# Patient Record
Sex: Female | Born: 2000 | Race: Black or African American | Hispanic: No | Marital: Single | Smoking: Never smoker
Health system: Southern US, Community
[De-identification: ages and names within clinical notes are randomized; demographics above are authoritative.]

---
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
## 2019-01-16 NOTE — L&D Delivery Note (Signed)
[facility]N [facility] Delivery Note  Amy Graham is a 19 y.o. G1P1001 s/p vag del at [redacted]w[redacted]d. She was admitted for SROM/SOL.   ROM: 11h 44m with clear fluid GBS Status: neg Maximum Maternal Temperature: 98.7  Labor Progress: [name] Ms Tibbitts is a G1 who presented with SROM and in latent labor. She progressed spontaneously to vag del.  Delivery Date/Time: December 16, 2019 at 1950 Delivery: Called to room and patient was complete and pushing. Head delivered ROA. Nuchal cord present x 1, easily reduced prior to del. Shoulder and body delivered in usual fashion. Infant with spontaneous cry, placed on mother's abdomen, dried and stimulated. Cord clamped x 2 after 1-minute delay, and cut by mother of pt. Cord blood drawn. Placenta delivered spontaneously with gentle cord traction. Fundus firm with massage and Pitocin. Labia, perineum, vagina, and cervix inspected and found to have 1st degree perineal lac.   Placenta: spont, intact Complications: none Lacerations: 1st degree perineal lac, repaired in the usual fashion with 3-0 Vicryl EBL: 350cc Analgesia: 1% lidocaine  Postpartum Planning [x]  message to sent to schedule follow-up   Infant: boy  APGARs 9/9  3436g (7lb 9.2oz)  11/9, CNM  12/16/2019 9:28 PM

## 2019-05-09 ENCOUNTER — Encounter (HOSPITAL_COMMUNITY): Payer: Self-pay

## 2019-05-09 ENCOUNTER — Ambulatory Visit (HOSPITAL_COMMUNITY)
Admission: EM | Admit: 2019-05-09 | Discharge: 2019-05-09 | Disposition: A | Discharge status: Home / Self Care | Payer: Medicaid Other | Attending: Family Medicine | Admitting: Family Medicine

## 2019-05-09 ENCOUNTER — Other Ambulatory Visit: Payer: Self-pay

## 2019-05-09 DIAGNOSIS — Z3201 Encounter for pregnancy test, result positive: Secondary | ICD-10-CM | POA: Insufficient documentation

## 2019-05-09 DIAGNOSIS — R112 Nausea with vomiting, unspecified: Secondary | ICD-10-CM | POA: Insufficient documentation

## 2019-05-09 DIAGNOSIS — Z20822 Contact with and (suspected) exposure to covid-19: Secondary | ICD-10-CM | POA: Diagnosis not present

## 2019-05-09 LAB — POCT URINALYSIS DIP (DEVICE)
Bilirubin Urine: NEGATIVE
Glucose, UA: NEGATIVE mg/dL
Hgb urine dipstick: NEGATIVE
Ketones, ur: NEGATIVE mg/dL
Nitrite: NEGATIVE
Protein, ur: 30 mg/dL — AB
Specific Gravity, Urine: 1.02 (ref 1.005–1.030)
Urobilinogen, UA: 1 mg/dL (ref 0.0–1.0)
pH: 7.5 (ref 5.0–8.0)

## 2019-05-09 LAB — SARS CORONAVIRUS 2 (TAT 6-24 HRS): SARS Coronavirus 2: NEGATIVE

## 2019-05-09 LAB — POCT PREGNANCY, URINE: Preg Test, Ur: POSITIVE — AB

## 2019-05-09 LAB — POC URINE PREG, ED: Preg Test, Ur: POSITIVE — AB

## 2019-05-09 MED ORDER — PRENATAL MULTIVITAMIN CH: 1.0000 | ORAL_TABLET | Freq: Every day | ORAL | 2 refills | Status: AC

## 2019-05-09 MED ORDER — ONDANSETRON 4 MG PO TBDP: 4.0000 mg | ORAL_TABLET | Freq: Three times a day (TID) | ORAL | 0 refills | Status: DC | PRN

## 2019-05-09 NOTE — ED Provider Notes (Signed)
[place]   [id number] 05/09/19 Arrival Time: 8469  ASSESSMENT & PLAN:  1. Positive pregnancy test   2. Intractable vomiting with nausea, unspecified vomiting type     Benign abdominal exam. No indications for urgent abdominal/pelvic imaging at this time. Discussed. Urine culture sent.  Begin: Meds ordered this encounter  Medications  . ondansetron (ZOFRAN-ODT) 4 MG disintegrating tablet    Sig: Take 1 tablet (4 mg total) by mouth every 8 (eight) hours as needed for nausea or vomiting.    Dispense:  20 tablet    Refill:  0  . Prenatal Vit-Fe Fumarate-FA (PRENATAL MULTIVITAMIN) TABS tablet    Sig: Take 1 tablet by mouth daily at 12 noon.    Dispense:  30 tablet    Refill:  2    Follow-up Information    Schedule an appointment as soon as possible for a visit  with [facility].   Specialty: Obstetrics and Gynecology Contact information: [address], [place] [id number] [place] [phone]          Will do her best to increase fluid intake.  Reviewed expectations re: course of current medical issues. Questions answered. Outlined signs and symptoms indicating need for more acute intervention. Patient verbalized understanding. After Visit Summary given.   SUBJECTIVE: History from: patient. Amy Graham is a 19 y.o. female who presents requesting UPT. Pos at home. Reports nausea with occas emesis. Abd cramping without significant pain. No vaginal bleeding or LOF. Patient's last menstrual period was 03/25/2019. Feels she is drinking enough fluids. G1P0. Some fatigue.  History reviewed. No pertinent surgical history.   OBJECTIVE:  Vitals:   05/09/19 1114 05/09/19 1117  BP:  109/71  Pulse:  75  Resp:  17  Temp:  98.3 F (36.8 C)  TempSrc:  Oral  SpO2:  100%  Weight: 62.6 kg     General appearance: alert, oriented, no acute distress HEENT: [place]; AT; oropharynx  moist Lungs: unlabored respirations Abdomen: soft; without distention; no specific tenderness to palpation; without guarding or rebound tenderness Back: without reported CVA tenderness; FROM at waist Extremities: without LE edema; symmetrical; without gross deformities Skin: warm and dry Neurologic: normal gait Psychological: alert and cooperative; normal mood and affect  Labs: Results for orders placed or performed during the hospital encounter of 05/09/19  POC urine pregnancy  Result Value Ref Range   Preg Test, Ur POSITIVE (A) NEGATIVE  Pregnancy, urine POC  Result Value Ref Range   Preg Test, Ur POSITIVE (A) NEGATIVE  POCT urinalysis dip (device)  Result Value Ref Range   Glucose, UA NEGATIVE NEGATIVE mg/dL   Bilirubin Urine NEGATIVE NEGATIVE   Ketones, ur NEGATIVE NEGATIVE mg/dL   Specific Gravity, Urine 1.020 1.005 - 1.030   Hgb urine dipstick NEGATIVE NEGATIVE   pH 7.5 5.0 - 8.0   Protein, ur 30 (A) NEGATIVE mg/dL   Urobilinogen, UA 1.0 0.0 - 1.0 mg/dL   Nitrite NEGATIVE NEGATIVE   Leukocytes,Ua TRACE (A) NEGATIVE     No Known Allergies                                             History reviewed. No pertinent past medical history.  Social History   Socioeconomic History  . Marital status: Single    Spouse name: Not on file  .  Number of children: Not on file  . Years of education: Not on file  . Highest education level: Not on file  Occupational History  . Not on file  Tobacco Use  . Smoking status: Never Smoker  . Smokeless tobacco: Never Used  Substance and Sexual Activity  . Alcohol use: No  . Drug use: No  . Sexual activity: Yes    Birth control/protection: None  Other Topics Concern  . Not on file  Social History Narrative  . Not on file   Social Determinants of Health   Financial Resource Strain:   . Difficulty of Paying Living Expenses:   Food Insecurity:   . Worried About Programme researcher, broadcasting/film/video in the Last Year:   . Barista in the  Last Year:   Transportation Needs:   . Freight forwarder (Medical):   [name] Lack of Transportation (Non-Medical):   Physical Activity:   . Days of Exercise per Week:   . Minutes of Exercise per Session:   Stress:   . Feeling of Stress :   Social Connections:   . Frequency of Communication with Friends and Family:   . Frequency of Social Gatherings with Friends and Family:   . Attends Religious Services:   . Active Member of Clubs or Organizations:   . Attends Banker Meetings:   [name] Marital Status:   Intimate Partner Violence:   . Fear of Current or Ex-Partner:   . Emotionally Abused:   [name] Physically Abused:   . Sexually Abused:     Family History  Problem Relation Age of Onset  . Diabetes Mother   . Healthy Father      Mardella Layman, MD 05/09/19 1229

## 2019-05-09 NOTE — ED Triage Notes (Signed)
Pt is here with abdominal pain, vomiting that started 3 days ago. Pt has not taken any meds to relieve discomfort. Pt states she took a pregnancy test on 04/28/2019 that was POSITIVE, she wants another test to confirm.

## 2019-05-10 LAB — URINE CULTURE

## 2019-05-14 ENCOUNTER — Encounter (HOSPITAL_COMMUNITY): Payer: Self-pay | Admitting: *Deleted

## 2019-05-14 ENCOUNTER — Inpatient Hospital Stay (HOSPITAL_COMMUNITY): Payer: Medicaid Other

## 2019-05-14 ENCOUNTER — Inpatient Hospital Stay (HOSPITAL_COMMUNITY)
Admission: AD | Admit: 2019-05-14 | Discharge: 2019-05-14 | Disposition: A | Discharge status: Home / Self Care | Payer: Medicaid Other | Attending: Obstetrics & Gynecology | Admitting: Obstetrics & Gynecology

## 2019-05-14 DIAGNOSIS — O210 Mild hyperemesis gravidarum: Secondary | ICD-10-CM | POA: Insufficient documentation

## 2019-05-14 DIAGNOSIS — R109 Unspecified abdominal pain: Secondary | ICD-10-CM | POA: Diagnosis present

## 2019-05-14 DIAGNOSIS — Z3490 Encounter for supervision of normal pregnancy, unspecified, unspecified trimester: Secondary | ICD-10-CM

## 2019-05-14 DIAGNOSIS — O219 Vomiting of pregnancy, unspecified: Secondary | ICD-10-CM

## 2019-05-14 DIAGNOSIS — Z3A01 Less than 8 weeks gestation of pregnancy: Secondary | ICD-10-CM | POA: Insufficient documentation

## 2019-05-14 DIAGNOSIS — O26899 Other specified pregnancy related conditions, unspecified trimester: Secondary | ICD-10-CM

## 2019-05-14 DIAGNOSIS — O26891 Other specified pregnancy related conditions, first trimester: Secondary | ICD-10-CM | POA: Diagnosis not present

## 2019-05-14 LAB — CBC
HCT: 38.2 % (ref 36.0–46.0)
Hemoglobin: 12.6 g/dL (ref 12.0–15.0)
MCH: 29 pg (ref 26.0–34.0)
MCHC: 33 g/dL (ref 30.0–36.0)
MCV: 88 fL (ref 80.0–100.0)
Platelets: 336 10*3/uL (ref 150–400)
RBC: 4.34 MIL/uL (ref 3.87–5.11)
RDW: 12 % (ref 11.5–15.5)
WBC: 7 10*3/uL (ref 4.0–10.5)
nRBC: 0 % (ref 0.0–0.2)

## 2019-05-14 LAB — URINALYSIS, ROUTINE W REFLEX MICROSCOPIC
Bilirubin Urine: NEGATIVE
Glucose, UA: NEGATIVE mg/dL
Hgb urine dipstick: NEGATIVE
Ketones, ur: NEGATIVE mg/dL
Nitrite: NEGATIVE
Protein, ur: 30 mg/dL — AB
Specific Gravity, Urine: 1.029 (ref 1.005–1.030)
pH: 5 (ref 5.0–8.0)

## 2019-05-14 LAB — WET PREP, GENITAL
Clue Cells Wet Prep HPF POC: NONE SEEN
Sperm: NONE SEEN
Trich, Wet Prep: NONE SEEN
Yeast Wet Prep HPF POC: NONE SEEN

## 2019-05-14 LAB — COMPREHENSIVE METABOLIC PANEL
ALT: 16 U/L (ref 0–44)
AST: 18 U/L (ref 15–41)
Albumin: 3.6 g/dL (ref 3.5–5.0)
Alkaline Phosphatase: 54 U/L (ref 38–126)
Anion gap: 6 (ref 5–15)
BUN: 6 mg/dL (ref 6–20)
CO2: 27 mmol/L (ref 22–32)
Calcium: 9.4 mg/dL (ref 8.9–10.3)
Chloride: 103 mmol/L (ref 98–111)
Creatinine, Ser: 0.67 mg/dL (ref 0.44–1.00)
GFR calc Af Amer: >60 mL/min (ref >60)
GFR calc non Af Amer: >60 mL/min (ref >60)
Glucose, Bld: 86 mg/dL (ref 70–99)
Potassium: 3.9 mmol/L (ref 3.5–5.1)
Sodium: 136 mmol/L (ref 135–145)
Total Bilirubin: 0.5 mg/dL (ref 0.3–1.2)
Total Protein: 6.9 g/dL (ref 6.5–8.1)

## 2019-05-14 LAB — ABO/RH: ABO/RH(D): A POS

## 2019-05-14 LAB — HCG, QUANTITATIVE, PREGNANCY: hCG, Beta Chain, Quant, S: 101837 m[IU]/mL — ABNORMAL HIGH (ref <5)

## 2019-05-14 NOTE — MAU Provider Note (Signed)
History     CSN: [id number]  Arrival date and time: 05/14/19 1241   First Provider Initiated Contact with Patient 05/14/19 1353      Chief Complaint  Patient presents with  . Abdominal Pain  . Emesis  . Nausea  . Dizziness   Ms. Amy Graham is a 19 y.o. G1P0 at [redacted]w[redacted]d who presents to MAU for sharp abdominal pain.  Onset: few days ago Location: lower abdomen Duration: few days Character: sharp, intermittent Aggravating/Associated: none/intermittent nausea and vomiting, not present at this time Relieving: none Treatment: ginger ale - worked Severity: 2/10  Pt denies VB, vaginal discharge/odor/itching. Pt denies constipation, diarrhea, or urinary problems. Pt denies fever, chills, fatigue, sweating or changes in appetite. Pt denies SOB or chest pain. Pt denies dizziness, HA, light-headedness, weakness.  Problems this pregnancy include: pt has not yet been seen. Allergies? NKDA Current medications/supplements? PNVs Prenatal care provider? List of OB providers given  Patient's mother present for entire visit.   OB History    Gravida  1   Para      Term      Preterm      AB      Living        SAB      TAB      Ectopic      Multiple      Live Births              History reviewed. No pertinent past medical history.  History reviewed. No pertinent surgical history.  Family History  Problem Relation Age of Onset  . Diabetes Mother   . Healthy Father     Social History   Tobacco Use  . Smoking status: Never Smoker  . Smokeless tobacco: Never Used  Substance Use Topics  . Alcohol use: No  . Drug use: No    Allergies: No Known Allergies  Medications Prior to Admission  Medication Sig Dispense Refill Last Dose  . ondansetron (ZOFRAN-ODT) 4 MG disintegrating tablet Take 1 tablet (4 mg total) by mouth every 8 (eight) hours as needed for nausea or vomiting. 20 tablet 0 05/13/2019 at Unknown time  . Prenatal Vit-Fe Fumarate-FA (PRENATAL  MULTIVITAMIN) TABS tablet Take 1 tablet by mouth daily at 12 noon. 30 tablet 2 05/14/2019 at Unknown time  . clobetasol ointment (TEMOVATE) 0.05 % Apply 1 application topically 2 (two) times daily. To hand 30 g 0   . Erythromycin 2 % ointment Apply to axilla twice daily for 10 days 50 g 1     Review of Systems  Constitutional: Negative for chills, diaphoresis, fatigue and fever.  Eyes: Negative for visual disturbance.  Respiratory: Negative for shortness of breath.   Cardiovascular: Negative for chest pain.  Gastrointestinal: Positive for abdominal pain. Negative for constipation, diarrhea, nausea and vomiting.  Genitourinary: Negative for dysuria, flank pain, frequency, pelvic pain, urgency, vaginal bleeding and vaginal discharge.  Neurological: Negative for dizziness, weakness, light-headedness and headaches.   Physical Exam   Blood pressure 111/76, pulse 85, temperature 99 F (37.2 C), resp. rate 16, height 5\' 5"  (1.651 m), weight 62.3 kg, last menstrual period 03/25/2019, SpO2 100 %.  Patient Vitals for the past 24 hrs:  BP Temp Pulse Resp SpO2 Height Weight  05/14/19 1302 111/76 99 F (37.2 C) 85 16 100 % 5\' 5"  (1.651 m) 62.3 kg   Physical Exam  Constitutional: She is oriented to person, place, and time. She appears well-developed and well-nourished. No distress.  HENT:  Head: Normocephalic and atraumatic.  Respiratory: Effort normal.  GI: Soft. She exhibits no distension and no mass. There is no abdominal tenderness. There is no rebound and no guarding.  Genitourinary:    No vaginal discharge.   Neurological: She is alert and oriented to person, place, and time.  Skin: Skin is warm and dry. She is not diaphoretic.  Psychiatric: She has a normal mood and affect. Her behavior is normal. Judgment and thought content normal.   Results for orders placed or performed during the hospital encounter of 05/14/19 (from the past 24 hour(s))  Urinalysis, Routine w reflex microscopic      Status: Abnormal   Collection Time: 05/14/19  1:44 PM  Result Value Ref Range   Color, Urine YELLOW YELLOW   APPearance HAZY (A) CLEAR   Specific Gravity, Urine 1.029 1.005 - 1.030   pH 5.0 5.0 - 8.0   Glucose, UA NEGATIVE NEGATIVE mg/dL   Hgb urine dipstick NEGATIVE NEGATIVE   Bilirubin Urine NEGATIVE NEGATIVE   Ketones, ur NEGATIVE NEGATIVE mg/dL   Protein, ur 30 (A) NEGATIVE mg/dL   Nitrite NEGATIVE NEGATIVE   Leukocytes,Ua SMALL (A) NEGATIVE   RBC / HPF 0-5 0 - 5 RBC/hpf   WBC, UA 6-10 0 - 5 WBC/hpf   Bacteria, UA RARE (A) NONE SEEN   Squamous Epithelial / LPF 6-10 0 - 5   Mucus PRESENT   Wet prep, genital     Status: Abnormal   Collection Time: 05/14/19  1:50 PM  Result Value Ref Range   Yeast Wet Prep HPF POC NONE SEEN NONE SEEN   Trich, Wet Prep NONE SEEN NONE SEEN   Clue Cells Wet Prep HPF POC NONE SEEN NONE SEEN   WBC, Wet Prep HPF POC RARE (A) NONE SEEN   Sperm NONE SEEN   CBC     Status: None   Collection Time: 05/14/19  1:52 PM  Result Value Ref Range   WBC 7.0 4.0 - 10.5 K/uL   RBC 4.34 3.87 - 5.11 MIL/uL   Hemoglobin 12.6 12.0 - 15.0 g/dL   HCT 38.2 36.0 - 46.0 %   MCV 88.0 80.0 - 100.0 fL   MCH 29.0 26.0 - 34.0 pg   MCHC 33.0 30.0 - 36.0 g/dL   RDW 12.0 11.5 - 15.5 %   Platelets 336 150 - 400 K/uL   nRBC 0.0 0.0 - 0.2 %  Comprehensive metabolic panel     Status: None   Collection Time: 05/14/19  1:52 PM  Result Value Ref Range   Sodium 136 135 - 145 mmol/L   Potassium 3.9 3.5 - 5.1 mmol/L   Chloride 103 98 - 111 mmol/L   CO2 27 22 - 32 mmol/L   Glucose, Bld 86 70 - 99 mg/dL   BUN 6 6 - 20 mg/dL   Creatinine, Ser 0.67 0.44 - 1.00 mg/dL   Calcium 9.4 8.9 - 10.3 mg/dL   Total Protein 6.9 6.5 - 8.1 g/dL   Albumin 3.6 3.5 - 5.0 g/dL   AST 18 15 - 41 U/L   ALT 16 0 - 44 U/L   Alkaline Phosphatase 54 38 - 126 U/L   Total Bilirubin 0.5 0.3 - 1.2 mg/dL   GFR calc non Af Amer >60 >60 mL/min   GFR calc Af Amer >60 >60 mL/min   Anion gap 6 5 - 15  hCG,  quantitative, pregnancy     Status: Abnormal   Collection Time: 05/14/19  1:52 PM  Result Value Ref Range   hCG, Beta Chain, Quant, S 101,837 (H) <5 mIU/mL  ABO/Rh     Status: None   Collection Time: 05/14/19  1:52 PM  Result Value Ref Range   ABO/RH(D) A POS    No rh immune globuloin      NOT A RH IMMUNE GLOBULIN CANDIDATE, PT RH POSITIVE Performed at [facility], [address], [place], [place] [address]     MAU Course  Procedures  MDM -r/o ectopic -UA: hazy/30PRO/sm leuks/rare bacteria, sending urine for culture -CBC: WNL -CMP: WNL -[place]: single IUP, [redacted]w[redacted]d, FHR 134 -hCG: pending at time of discharge -ABO: A Positive -WetPrep: WNL -GC/CT collected -pt discharged to home in stable condition  Orders Placed This Encounter  Procedures  . Wet prep, genital    Standing Status:   Standing    Number of Occurrences:   1  . Culture, OB Urine    Standing Status:   Standing    Number of Occurrences:   1  . US OB LESS THAN 14 WEEKS WITH OB TRANSVAGINAL    Standing Status:   Standing    Number of Occurrences:   1    Order Specific Question:   Symptom/Reason for Exam    Answer:   Abdominal pain in pregnancy [[id number]]  . Urinalysis, Routine w reflex microscopic    Standing Status:   Standing    Number of Occurrences:   1  . CBC    Standing Status:   Standing    Number of Occurrences:   1  . Comprehensive metabolic panel    Standing Status:   Standing    Number of Occurrences:   1  . hCG, quantitative, pregnancy    Standing Status:   Standing    Number of Occurrences:   1  . ABO/Rh    Standing Status:   Standing    Number of Occurrences:   1  . Discharge patient    Order Specific Question:   Discharge disposition    Answer:   01-Home or Self Care [1]    Order Specific Question:   Discharge patient date    Answer:   05/14/2019   No orders of the defined types were placed in this encounter.  Assessment and Plan   1. Abdominal pain in pregnancy   2. Intrauterine  pregnancy   3. [redacted] weeks gestation of pregnancy   4. Nausea and vomiting in pregnancy     Allergies as of 05/14/2019   No Known Allergies     Medication List    STOP taking these medications   ondansetron 4 MG disintegrating tablet Commonly known as: ZOFRAN-ODT     TAKE these medications   clobetasol ointment 0.05 % Commonly known as: TEMOVATE Apply 1 application topically 2 (two) times daily. To hand   Erythromycin 2 % ointment Apply to axilla twice daily for 10 days   prenatal multivitamin Tabs tablet Take 1 tablet by mouth daily at 12 noon.       -will call with culture results, if positive -Discussed the risk of using Zofran in early pregnancy: Available data suggest that use of Zofran (Ondansetron) in early pregnancy is not associated with a high risk of congenital malformations but a small absolute increase in risk of CV malformations (especially septum defects) and cleft palate may exist. The patient was warned of SE of constipation. Discussed Diclegis and other meds as alternatives prior to using Zofran. -pt to schedule NOB  visit, list of providers given -safe meds in pregnancy list given with focus on OTC Diclegis for N/V -discussed nonpharmacologic and pharmacologic treatments of N/V -discussed normal expectations for N/V in pregnancy -return MAU precautions given -pt discharged to home in stable condition  Joni Reining E Goro Wenrick 05/14/2019, 3:52 PM

## 2019-05-14 NOTE — MAU Note (Signed)
Having sharp, severe cramping in abd.  Was mild when went to ER. "also having little heartburns. Been vomiting, feeling light headed and dizzy. Having flutters in her heart."

## 2019-05-14 NOTE — Discharge Instructions (Signed)
Safe Medications in Pregnancy    Acne: Benzoyl Peroxide Salicylic Acid  Backache/Headache: Tylenol: 2 regular strength every 4 hours OR              2 Extra strength every 6 hours  Colds/Coughs/Allergies: Benadryl (alcohol free) 25 mg every 6 hours as needed Breath right strips Claritin Cepacol throat lozenges Chloraseptic throat spray Cold-Eeze- up to three times per day Cough drops, alcohol free Flonase (by prescription only) Guaifenesin Mucinex Robitussin DM (plain only, alcohol free) Saline nasal spray/drops Sudafed (pseudoephedrine) & Actifed ** use only after [redacted] weeks gestation and if you do not have high blood pressure Tylenol Vicks Vaporub Zinc lozenges Zyrtec   Constipation: Colace Ducolax suppositories Fleet enema Glycerin suppositories Metamucil Milk of magnesia Miralax Senokot Smooth move tea  Diarrhea: Kaopectate Imodium A-D  *NO pepto Bismol  Hemorrhoids: Anusol Anusol HC Preparation H Tucks  Indigestion: Tums Maalox Mylanta Zantac  Pepcid  Insomnia: Benadryl (alcohol free)  every 6 hours as needed Tylenol PM Unisom, no Gelcaps  Leg Cramps: Tums MagGel  Nausea/Vomiting:  Bonine Dramamine Emetrol Ginger extract Sea bands Meclizine  - DIZZINESS Nausea medication to take during pregnancy:  Unisom (doxylamine succinate 25 mg tablets) Take one tablet daily at bedtime. If symptoms are not adequately controlled, the dose can be increased to a maximum recommended dose of two tablets daily (1/2 tablet in the morning, 1/2 tablet mid-afternoon and one at bedtime). Vitamin B6  tablets. Take one tablet twice a day (up to 200 mg per day).  Skin Rashes: Aveeno products Benadryl cream or  every 6 hours as needed Calamine Lotion 1% cortisone cream  Yeast infection: Gyne-lotrimin 7 Monistat 7   **If taking multiple medications, please check labels to avoid duplicating the same active  ingredients **take medication as directed on the label ** Do not exceed 4000 mg of tylenol in 24 hours **Do not take medications that contain aspirin or ibuprofen         Eating Plan for Pregnant Women While you are pregnant, your body requires additional nutrition to help support your growing baby. You also have a higher need for some vitamins and minerals, such as folic acid, calcium, iron, and vitamin D. Eating a healthy, well-balanced diet is very important for your health and your baby's health. Your need for extra calories varies for the three 58-month segments of your pregnancy (trimesters). For most women, it is recommended to consume:  150 extra calories a day during the first trimester.  300 extra calories a day during the second trimester.  300 extra calories a day during the third trimester. What are tips for following this plan?   Do not try to lose weight or go on a diet during pregnancy.  Limit your overall intake of foods that have "empty calories." These are foods that have little nutritional value, such as sweets, desserts, candies, and sugar-sweetened beverages.  Eat a variety of foods (especially fruits and vegetables) to get a full range of vitamins and minerals.  Take a prenatal vitamin to help meet your additional vitamin and mineral needs during pregnancy, specifically for folic acid, iron, calcium, and vitamin D.  Remember to stay active. Ask your health care provider what types of exercise and activities are safe for you.  Practice good food safety and cleanliness. Wash your hands before you eat and after you prepare raw meat. Wash all fruits and vegetables well before peeling or eating. Taking these actions can help to prevent food-borne illnesses that can  be very dangerous to your baby, such as listeriosis. Ask your health care provider for more information about listeriosis. What does 150 extra calories look like? Healthy options that provide 150 extra  calories each day could be any of the following:  6-8 oz (170-230 g) of plain low-fat yogurt with  cup of berries.  1 apple with 2 teaspoons (11 g) of peanut butter.  Cut-up vegetables with  cup (60 g) of hummus.  8 oz (230 mL) or 1 cup of low-fat chocolate milk.  1 stick of string cheese with 1 medium orange.  1 peanut butter and jelly sandwich that is made with one slice of whole-wheat bread and 1 tsp (5 g) of peanut butter. For 300 extra calories, you could eat two of those healthy options each day. What is a healthy amount of weight to gain? The right amount of weight gain for you is based on your BMI before you became pregnant. If your BMI:  Was less than 18 (underweight), you should gain 28-40 lb (13-18 kg).  Was 18-24.9 (normal), you should gain 25-35 lb (11-16 kg).  Was 25-29.9 (overweight), you should gain 15-25 lb (7-11 kg).  Was 30 or greater (obese), you should gain 11-20 lb (5-9 kg). What if I am having twins or multiples? Generally, if you are carrying twins or multiples:  You may need to eat 300-600 extra calories a day.  The recommended range for total weight gain is 25-54 lb (11-25 kg), depending on your BMI before pregnancy.  Talk with your health care provider to find out about nutritional needs, weight gain, and exercise that is right for you. What foods can I eat?  Fruits All fruits. Eat a variety of colors and types of fruit. Remember to wash your fruits well before peeling or eating. Vegetables All vegetables. Eat a variety of colors and types of vegetables. Remember to wash your vegetables well before peeling or eating. Grains All grains. Choose whole grains, such as whole-wheat bread, oatmeal, or brown rice. Meats and other protein foods Lean meats, including chicken, [place], fish, and lean cuts of beef, veal, or pork. If you eat fish or seafood, choose options that are higher in omega-3 fatty acids and lower in mercury, such as salmon, herring,  mussels, trout, sardines, pollock, shrimp, crab, and lobster. Tofu. Tempeh. Beans. Eggs. Peanut butter and other nut butters. Make sure that all meats, poultry, and eggs are cooked to food-safe temperatures or "well-done." Two or more servings of fish are recommended each week in order to get the most benefits from omega-3 fatty acids that are found in seafood. Choose fish that are lower in mercury. You can find more information online:  PumpkinSearch.com.ee Dairy Pasteurized milk and milk alternatives (such as almond milk). Pasteurized yogurt and pasteurized cheese. Cottage cheese. Sour cream. Beverages Water. Juices that contain 100% fruit juice or vegetable juice. Caffeine-free teas and decaffeinated coffee. Drinks that contain caffeine are okay to drink, but it is better to avoid caffeine. Keep your total caffeine intake to less than 200 mg each day (which is 12 oz or 355 mL of coffee, tea, or soda) or the limit as told by your health care provider. Fats and oils Fats and oils are okay to include in moderation. Sweets and desserts Sweets and desserts are okay to include in moderation. Seasoning and other foods All pasteurized condiments. The items listed above may not be a complete list of foods and beverages you can eat. Contact a dietitian for more  information. What foods are not recommended? Fruits Unpasteurized fruit juices. Vegetables Raw (unpasteurized) vegetable juices. Meats and other protein foods Lunch meats, bologna, hot dogs, or other deli meats. (If you must eat those meats, reheat them until they are steaming hot.) Refrigerated pat, meat spreads from a meat counter, smoked seafood that is found in the refrigerated section of a store. Raw or undercooked meats, poultry, and eggs. Raw fish, such as sushi or sashimi. Fish that have high mercury content, such as tilefish, shark, swordfish, and king mackerel. To learn more about mercury in fish, talk with your health care provider or look  for online resources, such as:  PumpkinSearch.com.ee Dairy Raw (unpasteurized) milk and any foods that have raw milk in them. Soft cheeses, such as feta, queso blanco, queso fresco, Brie, Camembert cheeses, blue-veined cheeses, and Panela cheese (unless it is made with pasteurized milk, which must be stated on the label). Beverages Alcohol. Sugar-sweetened beverages, such as sodas, teas, or energy drinks. Seasoning and other foods Homemade fermented foods and drinks, such as pickles, sauerkraut, or kombucha drinks. (Store-bought pasteurized versions of these are okay.) Salads that are made in a store or deli, such as ham salad, chicken salad, egg salad, tuna salad, and seafood salad. The items listed above may not be a complete list of foods and beverages you should avoid. Contact a dietitian for more information. Where to find more information To calculate the number of calories you need based on your height, weight, and activity level, you can use an online calculator such as:  PackageNews.is To calculate how much weight you should gain during pregnancy, you can use an online pregnancy weight gain calculator such as:  http://jones-berg.com/ Summary  While you are pregnant, your body requires additional nutrition to help support your growing baby.  Eat a variety of foods, especially fruits and vegetables to get a full range of vitamins and minerals.  Practice good food safety and cleanliness. Wash your hands before you eat and after you prepare raw meat. Wash all fruits and vegetables well before peeling or eating. Taking these actions can help to prevent food-borne illnesses, such as listeriosis, that can be very dangerous to your baby.  Do not eat raw meat or fish. Do not eat fish that have high mercury content, such as tilefish, shark, swordfish, and king mackerel. Do not eat unpasteurized (raw) dairy.  Take a prenatal vitamin to help meet  your additional vitamin and mineral needs during pregnancy, specifically for folic acid, iron, calcium, and vitamin D. This information is not intended to replace advice given to you by your health care provider. Make sure you discuss any questions you have with your health care provider. Document Revised: 05/22/2018 Document Reviewed: 09/28/2016 Elsevier Patient Education  2020 Elsevier Inc.     [place] [facility] Providers     [facility]     Phone: [phone]  [facility] [place]  Phone: [phone]  [facility] for [facility] [place]  Phone: [phone]  [facility] for [employer] at [place]                           Phone: [phone]  [facility] for [facility]          Phone: [phone]  [facility]    Phone: [phone]   [facility])    Phone: [phone]  [facility]  Phone: [phone]  [facility]    Phone: [phone]  [facility]    Phone: [phone]  [facility]  Phone: [phone]  [facility]               Phone: [phone]  [facility]   Phone: [phone]  [facility]    Phone: [phone]                   Abdominal Pain During Pregnancy  Abdominal pain is common during pregnancy, and has many possible causes. Some causes are more serious than others, and sometimes the cause is not known. Abdominal pain can be a sign that labor is starting. It can also be caused by normal growth and stretching of muscles and ligaments during pregnancy. Always tell your health care provider if you have any abdominal pain. Follow these instructions at home:  Do not have sex or put anything in your vagina until your pain goes away completely.  Get plenty of  rest until your pain improves.  Drink enough fluid to keep your urine pale yellow.  Take over-the-counter and prescription medicines only as told by your health care provider.  Keep all follow-up visits as told by your health care provider. This is important. Contact a health care provider if:  Your pain continues or gets worse after resting.  You have lower abdominal pain that: ? Comes and goes at regular intervals. ? Spreads to your back. ? Is similar to menstrual cramps.  You have pain or burning when you urinate. Get help right away if:  You have a fever or chills.  You have vaginal bleeding.  You are leaking fluid from your vagina.  You are passing tissue from your vagina.  You have vomiting or diarrhea that lasts for more than 24 hours.  Your baby is moving less than usual.  You feel very weak or faint.  You have shortness of breath.  You develop severe pain in your upper abdomen. Summary  Abdominal pain is common during pregnancy, and has many possible causes.  If you experience abdominal pain during pregnancy, tell your health care provider right away.  Follow your health care provider's home care instructions and keep all follow-up visits as directed. This information is not intended to replace advice given to you by your health care provider. Make sure you discuss any questions you have with your health care provider. Document Revised: 04/21/2018 Document Reviewed: 04/05/2016 Elsevier Patient Education  2020 [employer].        First Trimester of Pregnancy The first trimester of pregnancy is from week 1 until the end of week 13 (months 1 through 3). A week after a sperm fertilizes an egg, the egg will implant on the wall of the uterus. This embryo will begin to develop into a baby. Genes from you and your partner will form the baby. The female genes will determine whether the baby will be a boy or a girl. At 6-8 weeks, the eyes and face will be formed,  and the heartbeat can be seen on ultrasound. At the end of 12 weeks, all the baby's organs will be formed. Now that you are pregnant, you will want to do everything you can to have a healthy baby. Two of the most important things are to get good prenatal care and to follow your health care provider's instructions. Prenatal care is all the medical care you receive before  the baby's birth. This care will help prevent, find, and treat any problems during the pregnancy and childbirth. Body changes during your first trimester Your body goes through many changes during pregnancy. The changes vary from woman to woman.  You may gain or lose a couple of pounds at first.  You may feel sick to your stomach (nauseous) and you may throw up (vomit). If the vomiting is uncontrollable, call your health care provider.  You may tire easily.  You may develop headaches that can be relieved by medicines. All medicines should be approved by your health care provider.  You may urinate more often. Painful urination may mean you have a bladder infection.  You may develop heartburn as a result of your pregnancy.  You may develop constipation because certain hormones are causing the muscles that push stool through your intestines to slow down.  You may develop hemorrhoids or swollen veins (varicose veins).  Your breasts may begin to grow larger and become tender. Your nipples may stick out more, and the tissue that surrounds them (areola) may become darker.  Your gums may bleed and may be sensitive to brushing and flossing.  Dark spots or blotches (chloasma, mask of pregnancy) may develop on your face. This will likely fade after the baby is born.  Your menstrual periods will stop.  You may have a loss of appetite.  You may develop cravings for certain kinds of food.  You may have changes in your emotions from day to day, such as being excited to be pregnant or being concerned that something may go wrong with  the pregnancy and baby.  You may have more vivid and strange dreams.  You may have changes in your hair. These can include thickening of your hair, rapid growth, and changes in texture. Some women also have hair loss during or after pregnancy, or hair that feels dry or thin. Your hair will most likely return to normal after your baby is born. What to expect at prenatal visits During a routine prenatal visit:  You will be weighed to make sure you and the baby are growing normally.  Your blood pressure will be taken.  Your abdomen will be measured to track your baby's growth.  The fetal heartbeat will be listened to between weeks 10 and 14 of your pregnancy.  Test results from any previous visits will be discussed. Your health care provider may ask you:  How you are feeling.  If you are feeling the baby move.  If you have had any abnormal symptoms, such as leaking fluid, bleeding, severe headaches, or abdominal cramping.  If you are using any tobacco products, including cigarettes, chewing tobacco, and electronic cigarettes.  If you have any questions. Other tests that may be performed during your first trimester include:  Blood tests to find your blood type and to check for the presence of any previous infections. The tests will also be used to check for low iron levels (anemia) and protein on red blood cells (Rh antibodies). Depending on your risk factors, or if you previously had diabetes during pregnancy, you may have tests to check for high blood sugar that affects pregnant women (gestational diabetes).  Urine tests to check for infections, diabetes, or protein in the urine.  An ultrasound to confirm the proper growth and development of the baby.  Fetal screens for spinal cord problems (spina bifida) and Down syndrome.  HIV (human immunodeficiency virus) testing. Routine prenatal testing includes screening for HIV, unless you  choose not to have this test.  You may need  other tests to make sure you and the baby are doing well. Follow these instructions at home: Medicines  Follow your health care provider's instructions regarding medicine use. Specific medicines may be either safe or unsafe to take during pregnancy.  Take a prenatal vitamin that contains at least 600 micrograms (mcg) of folic acid.  If you develop constipation, try taking a stool softener if your health care provider approves. Eating and drinking   Eat a balanced diet that includes fresh fruits and vegetables, whole grains, good sources of protein such as meat, eggs, or tofu, and low-fat dairy. Your health care provider will help you determine the amount of weight gain that is right for you.  Avoid raw meat and uncooked cheese. These carry germs that can cause birth defects in the baby.  Eating four or five small meals rather than three large meals a day may help relieve nausea and vomiting. If you start to feel nauseous, eating a few soda crackers can be helpful. Drinking liquids between meals, instead of during meals, also seems to help ease nausea and vomiting.  Limit foods that are high in fat and processed sugars, such as fried and sweet foods.  To prevent constipation: ? Eat foods that are high in fiber, such as fresh fruits and vegetables, whole grains, and beans. ? Drink enough fluid to keep your urine clear or pale yellow. Activity  Exercise only as directed by your health care provider. Most women can continue their usual exercise routine during pregnancy. Try to exercise for 30 minutes at least 5 days a week. Exercising will help you: ? Control your weight. ? Stay in shape. ? Be prepared for labor and delivery.  Experiencing pain or cramping in the lower abdomen or lower back is a good sign that you should stop exercising. Check with your health care provider before continuing with normal exercises.  Try to avoid standing for long periods of time. Move your legs often if  you must stand in one place for a long time.  Avoid heavy lifting.  Wear low-heeled shoes and practice good posture.  You may continue to have sex unless your health care provider tells you not to. Relieving pain and discomfort  Wear a good support bra to relieve breast tenderness.  Take warm sitz baths to soothe any pain or discomfort caused by hemorrhoids. Use hemorrhoid cream if your health care provider approves.  Rest with your legs elevated if you have leg cramps or low back pain.  If you develop varicose veins in your legs, wear support hose. Elevate your feet for 15 minutes, 3-4 times a day. Limit salt in your diet. Prenatal care  Schedule your prenatal visits by the twelfth week of pregnancy. They are usually scheduled monthly at first, then more often in the last 2 months before delivery.  Write down your questions. Take them to your prenatal visits.  Keep all your prenatal visits as told by your health care provider. This is important. Safety  Wear your seat belt at all times when driving.  Make a list of emergency phone numbers, including numbers for family, friends, the hospital, and police and fire departments. General instructions  Ask your health care provider for a referral to a local prenatal education class. Begin classes no later than the beginning of month 6 of your pregnancy.  Ask for help if you have counseling or nutritional needs during pregnancy. Your health care  provider can offer advice or refer you to specialists for help with various needs.  Do not use hot tubs, steam rooms, or saunas.  Do not douche or use tampons or scented sanitary pads.  Do not cross your legs for long periods of time.  Avoid cat litter boxes and soil used by cats. These carry germs that can cause birth defects in the baby and possibly loss of the fetus by miscarriage or stillbirth.  Avoid all smoking, herbs, alcohol, and medicines not prescribed by your health care  provider. Chemicals in these products affect the formation and growth of the baby.  Do not use any products that contain nicotine or tobacco, such as cigarettes and e-cigarettes. If you need help quitting, ask your health care provider. You may receive counseling support and other resources to help you quit.  Schedule a dentist appointment. At home, brush your teeth with a soft toothbrush and be gentle when you floss. Contact a health care provider if:  You have dizziness.  You have mild pelvic cramps, pelvic pressure, or nagging pain in the abdominal area.  You have persistent nausea, vomiting, or diarrhea.  You have a bad smelling vaginal discharge.  You have pain when you urinate.  You notice increased swelling in your face, hands, legs, or ankles.  You are exposed to fifth disease or chickenpox.  You are exposed to [place] measles (rubella) and have never had it. Get help right away if:  You have a fever.  You are leaking fluid from your vagina.  You have spotting or bleeding from your vagina.  You have severe abdominal cramping or pain.  You have rapid weight gain or loss.  You vomit blood or material that looks like coffee grounds.  You develop a severe headache.  You have shortness of breath.  You have any kind of trauma, such as from a fall or a car accident. Summary  The first trimester of pregnancy is from week 1 until the end of week 13 (months 1 through 3).  Your body goes through many changes during pregnancy. The changes vary from woman to woman.  You will have routine prenatal visits. During those visits, your health care provider will examine you, discuss any test results you may have, and talk with you about how you are feeling. This information is not intended to replace advice given to you by your health care provider. Make sure you discuss any questions you have with your health care provider. Document Revised: 12/14/2016 Document Reviewed:  12/14/2015 Elsevier Patient Education  2020 [employer].

## 2019-05-15 LAB — CULTURE, OB URINE

## 2019-05-15 LAB — GC/CHLAMYDIA PROBE AMP (~~LOC~~) NOT AT ARMC
Chlamydia: NEGATIVE
Comment: NEGATIVE
Comment: NORMAL
Neisseria Gonorrhea: NEGATIVE

## 2019-06-10 ENCOUNTER — Ambulatory Visit (INDEPENDENT_AMBULATORY_CARE_PROVIDER_SITE_OTHER): Payer: Medicaid Other | Admitting: Advanced Practice Midwife

## 2019-06-10 ENCOUNTER — Encounter: Payer: Self-pay | Admitting: Advanced Practice Midwife

## 2019-06-10 ENCOUNTER — Other Ambulatory Visit: Payer: Self-pay

## 2019-06-10 VITALS — BP 116/76 | HR 70 | Wt 135.8 lb

## 2019-06-10 DIAGNOSIS — Z3401 Encounter for supervision of normal first pregnancy, first trimester: Secondary | ICD-10-CM

## 2019-06-10 DIAGNOSIS — Z3403 Encounter for supervision of normal first pregnancy, third trimester: Secondary | ICD-10-CM | POA: Insufficient documentation

## 2019-06-10 DIAGNOSIS — Z3A11 11 weeks gestation of pregnancy: Secondary | ICD-10-CM | POA: Diagnosis not present

## 2019-06-10 DIAGNOSIS — O219 Vomiting of pregnancy, unspecified: Secondary | ICD-10-CM | POA: Diagnosis not present

## 2019-06-10 MED ORDER — ASPIRIN EC 81 MG PO TBEC: 81.0000 mg | DELAYED_RELEASE_TABLET | Freq: Every day | ORAL | 2 refills | Status: DC

## 2019-06-10 MED ORDER — PYRIDOXINE HCL 25 MG PO TABS: 25.0000 mg | ORAL_TABLET | Freq: Three times a day (TID) | ORAL | 0 refills | Status: DC

## 2019-06-10 MED ORDER — DOXYLAMINE SUCCINATE (SLEEP) 25 MG PO TABS: 25.0000 mg | ORAL_TABLET | Freq: Three times a day (TID) | ORAL | 0 refills | Status: DC | PRN

## 2019-06-10 MED ORDER — BLOOD PRESSURE KIT: PACK | 0 refills | Status: DC

## 2019-06-10 MED ORDER — PROMETHAZINE HCL 25 MG PO TABS: 25.0000 mg | ORAL_TABLET | Freq: Four times a day (QID) | ORAL | 2 refills | Status: DC | PRN

## 2019-06-10 MED ORDER — VITAFOL GUMMIES 3.33-0.333-34.8 MG PO CHEW: 1.0000 | CHEWABLE_TABLET | Freq: Every day | ORAL | 5 refills | Status: DC

## 2019-06-10 NOTE — Patient Instructions (Signed)

## 2019-06-10 NOTE — Progress Notes (Signed)
History:   Amy Graham is a 19 y.o. G1P0 at [redacted]w[redacted]d by first trimester scan in [facility]. She is being seen today for her first obstetrical visit.  No obstetric history. Patient does intend to breast feed. Pregnancy history fully reviewed.  Patient reports fatigue, headaches, nausea and vomiting. She works part time as a Quarry manager. 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 She endorses headaches most days, managing with rest.    HISTORY: OB History  Gravida Para Term Preterm AB Living  1 0 0 0 0 0  SAB TAB Ectopic Multiple Live Births  0 0 0 0 0    # Outcome Date GA Lbr Len/2nd Weight Sex Delivery Anes PTL Lv  1 Current             No pap history, age 7.   History reviewed. No pertinent past medical history. History reviewed. No pertinent surgical history. Family History  Problem Relation Age of Onset  . Diabetes Mother   . Healthy Father    Social History   Tobacco Use  . Smoking status: Never Smoker  . Smokeless tobacco: Never Used  Substance Use Topics  . Alcohol use: No  . Drug use: No   No Known Allergies Current Outpatient Medications on File Prior to Visit  Medication Sig Dispense Refill  . clobetasol ointment (TEMOVATE) 0.81 % Apply 1 application topically 2 (two) times daily. To hand 30 g 0  . Erythromycin 2 % ointment Apply to axilla twice daily for 10 days 50 g 1  . Prenatal Vit-Fe Fumarate-FA (PRENATAL MULTIVITAMIN) TABS tablet Take 1 tablet by mouth daily at 12 noon. 30 tablet 2   No current facility-administered medications on file prior to visit.    Review of Systems Pertinent items noted in HPI and remainder of comprehensive ROS otherwise negative. Physical Exam:   Vitals:   06/10/19 1006  BP: 116/76  Pulse: 70  Weight: 135 lb 12.8 oz (61.6 kg)   Fetal Heart Rate (bpm): 173 Bedside Ultrasound for FHR check: Viable intrauterine pregnancy with positive cardiac activity note, heartrate bpm Patient informed that the ultrasound is  considered a limited obstetric ultrasound and is not intended to be a complete ultrasound exam.  Patient also informed that the ultrasound is not being completed with the intent of assessing for fetal or placental anomalies or any pelvic abnormalities.  Explained that the purpose of today's ultrasound is to assess for fetal heart rate.  Patient acknowledges the purpose of the exam and the limitations of the study. Pelvic Exam:  Not performed                      System: General: well-developed, well-nourished female in no acute distress   Breasts:  normal appearance, no masses or tenderness bilaterally   Skin: normal coloration and turgor, no rashes   Neurologic: oriented, normal, negative, normal mood   Extremities: normal strength, tone, and muscle mass, ROM of all joints is normal   HEENT PERRLA, extraocular movement intact and sclera clear, anicteric   Mouth/Teeth mucous membranes moist, pharynx normal without lesions and dental hygiene good   Neck supple and no masses   Cardiovascular: regular rate and rhythm   Respiratory:  no respiratory distress, normal breath sounds   Abdomen: soft, non-tender; bowel sounds normal; no masses,  no organomegaly    Assessment:    Pregnancy: G1P0 Patient Active Problem List   Diagnosis Date Noted  . Encounter for supervision  of normal first pregnancy in first trimester 06/10/2019   Indications for ASA therapy (per uptodate) Two or more of the following: Nulliparity Yes Obesity (body mass index >30 kg/m2) No Family history of preeclampsia in mother or sister No Age ?35 years No Sociodemographic characteristics (African American race, low socioeconomic level) Yes Personal risk factors (eg, previous pregnancy with low birth weight or small for gestational age infant, previous adverse pregnancy outcome [eg, stillbirth], interval >10 years between pregnancies) No  Plan:    1. Encounter for supervision of normal first pregnancy in first  trimester - Low risk OB, welcomed to practice - S/p MAU visit 04/29, confirmed delivery at [facility] in [place] - Advised restarting mild exercise, self care - Urine Culture - Babyscripts Schedule Optimization - CBC/D/Plt+RPR+Rh+ABO+Rub Ab... - Genetic Screening - Comprehensive metabolic panel - Protein / creatinine ratio, urine  2. Nausea and vomiting in pregnancy - B6, Unisom PRN. Phenergan for breakthrough - Food selection, encouraged to reduce sweet tea consumption, focus on room temperature water  3. Headache in pregnancy - Likely worsened by sugar consumption, dehydration, vomiting - Advised daily Zyrtec, cold compress, reduce screen time PRN, Tylenol - Can consult [facility] if symptoms do not improve  Initial labs drawn. Continue prenatal vitamins. Problem list reviewed and updated. Genetic Screening discussed, First trimester screen, Quad screen and NIPS: ordered. Ultrasound discussed; fetal anatomic survey: ordered. Discussed usage of Babyscripts and virtual visits as additional source of managing and completing prenatal visits in midst of coronavirus and pandemic.   Anticipatory guidance for prenatal visits including labs, ultrasounds, and testing; Initial labs drawn. Encouraged to complete MyChart Registration for her ability to review results, send requests, and have questions addressed.  The nature of [facility] with multiple MDs and Advanced Practice Providers was explained to patient; also emphasized that residents, students are part of our team. Routine obstetric precautions reviewed. Encouraged to seek out care at office or emergency room [facility] preferred) for urgent and/or emergent concerns. Return in about 4 weeks (around 07/08/2019).    Total visit time 60 minutes. Greater than 50% of visit spent in counseling and coordination of care.   Clayton Bibles, MSN, CNM Certified Nurse Midwife, [facility] for  [employer], [facility] 06/10/19 11:34 AM

## 2019-06-11 LAB — PROTEIN / CREATININE RATIO, URINE
Creatinine, Urine: 65.4 mg/dL
Protein, Ur: 7 mg/dL
Protein/Creat Ratio: 107 mg/g creat (ref 0–200)

## 2019-06-11 LAB — CBC/D/PLT+RPR+RH+ABO+RUB AB...
Antibody Screen: NEGATIVE
Basophils Absolute: 0.1 10*3/uL (ref 0.0–0.2)
Basos: 1 %
EOS (ABSOLUTE): 0.2 10*3/uL (ref 0.0–0.4)
Eos: 4 %
HCV Ab: <0.1 s/co ratio (ref 0.0–0.9)
HIV Screen 4th Generation wRfx: NONREACTIVE
Hematocrit: 39.7 % (ref 34.0–46.6)
Hemoglobin: 13.1 g/dL (ref 11.1–15.9)
Hepatitis B Surface Ag: NEGATIVE
Immature Grans (Abs): 0 10*3/uL (ref 0.0–0.1)
Immature Granulocytes: 0 %
Lymphocytes Absolute: 1.4 10*3/uL (ref 0.7–3.1)
Lymphs: 28 %
MCH: 29.1 pg (ref 26.6–33.0)
MCHC: 33 g/dL (ref 31.5–35.7)
MCV: 88 fL (ref 79–97)
Monocytes Absolute: 0.4 10*3/uL (ref 0.1–0.9)
Monocytes: 7 %
Neutrophils Absolute: 3 10*3/uL (ref 1.4–7.0)
Neutrophils: 60 %
Platelets: 278 10*3/uL (ref 150–450)
RBC: 4.5 x10E6/uL (ref 3.77–5.28)
RDW: 12.4 % (ref 11.7–15.4)
RPR Ser Ql: NONREACTIVE
Rh Factor: POSITIVE
Rubella Antibodies, IGG: 5.43 index (ref >0.99)
WBC: 5 10*3/uL (ref 3.4–10.8)

## 2019-06-11 LAB — COMPREHENSIVE METABOLIC PANEL
ALT: 11 IU/L (ref 0–32)
AST: 14 IU/L (ref 0–40)
Albumin/Globulin Ratio: 1.3 (ref 1.2–2.2)
Albumin: 4.3 g/dL (ref 3.9–5.0)
Alkaline Phosphatase: 61 IU/L (ref 45–106)
BUN/Creatinine Ratio: 13 (ref 9–23)
BUN: 9 mg/dL (ref 6–20)
Bilirubin Total: 0.2 mg/dL (ref 0.0–1.2)
CO2: 19 mmol/L — ABNORMAL LOW (ref 20–29)
Calcium: 9.7 mg/dL (ref 8.7–10.2)
Chloride: 100 mmol/L (ref 96–106)
Creatinine, Ser: 0.67 mg/dL (ref 0.57–1.00)
GFR calc Af Amer: 146 mL/min/{1.73_m2} (ref >59)
GFR calc non Af Amer: 127 mL/min/{1.73_m2} (ref >59)
Globulin, Total: 3.2 g/dL (ref 1.5–4.5)
Glucose: 71 mg/dL (ref 65–99)
Potassium: 4.3 mmol/L (ref 3.5–5.2)
Sodium: 134 mmol/L (ref 134–144)
Total Protein: 7.5 g/dL (ref 6.0–8.5)

## 2019-06-11 LAB — URINE CULTURE

## 2019-06-11 LAB — HCV INTERPRETATION

## 2019-06-16 ENCOUNTER — Telehealth: Payer: Self-pay | Admitting: Radiology

## 2019-06-16 ENCOUNTER — Encounter: Payer: Self-pay | Admitting: Radiology

## 2019-06-16 NOTE — Telephone Encounter (Signed)
Patient informed of Panorama results including sex

## 2019-06-23 ENCOUNTER — Encounter: Payer: Self-pay | Admitting: Student

## 2019-07-08 ENCOUNTER — Ambulatory Visit (INDEPENDENT_AMBULATORY_CARE_PROVIDER_SITE_OTHER): Payer: Medicaid Other | Admitting: Advanced Practice Midwife

## 2019-07-08 ENCOUNTER — Other Ambulatory Visit: Payer: Self-pay

## 2019-07-08 VITALS — BP 106/69 | HR 80 | Wt 137.0 lb

## 2019-07-08 DIAGNOSIS — Z3A15 15 weeks gestation of pregnancy: Secondary | ICD-10-CM

## 2019-07-08 DIAGNOSIS — Z3402 Encounter for supervision of normal first pregnancy, second trimester: Secondary | ICD-10-CM

## 2019-07-08 MED ORDER — LORATADINE 10 MG PO TABS: 10.0000 mg | ORAL_TABLET | Freq: Every day | ORAL | 3 refills | Status: DC

## 2019-07-08 NOTE — Progress Notes (Signed)
° °  PRENATAL VISIT NOTE  Subjective:  Amy Graham is a 19 y.o. G1P0 at [redacted]w[redacted]d being seen today for ongoing prenatal care.  She is currently monitored for the following issues for this low-risk pregnancy and has Encounter for supervision of normal first pregnancy in first trimester on their problem list.  Patient reports no complaints.  Contractions: Irritability. Vag. Bleeding: None.   . Denies leaking of fluid.   The following portions of the patient's history were reviewed and updated as appropriate: allergies, current medications, past family history, past medical history, past social history, past surgical history and problem list. Problem list updated.  Objective:   Vitals:   07/08/19 1103  BP: 106/69  Pulse: 80  Weight: 137 lb (62.1 kg)    Fetal Status: Fetal Heart Rate (bpm): 159         General:  Alert, oriented and cooperative. Patient is in no acute distress.  Skin: Skin is warm and dry. No rash noted.   Cardiovascular: Normal heart rate noted  Respiratory: Normal respiratory effort, no problems with respiration noted  Abdomen: Soft, gravid, appropriate for gestational age.  Pain/Pressure: Absent     Pelvic: Cervical exam deferred        Extremities: Normal range of motion.  Edema: None  Mental Status: Normal mood and affect. Normal behavior. Normal judgment and thought content.   Assessment and Plan:  Pregnancy: G1P0 at [redacted]w[redacted]d  1. Encounter for supervision of normal first pregnancy in second trimester - Routine care, discussed typical changes, complaints, concerns in second trimester - AFP today - [place] MFM OB COMP + 14 WK; Future - AFP, Serum, Open Spina Bifida  Preterm labor symptoms and general obstetric precautions including but not limited to vaginal bleeding, contractions, leaking of fluid and fetal movement were reviewed in detail with the patient. Please refer to After Visit Summary for other counseling recommendations.  Return in about 4 weeks (around  08/05/2019).  Future Appointments  Date Time Provider Department Center  08/05/2019 10:00 AM Calvert Cantor, [place] [facility]  08/05/2019 12:45 PM [facility]    Calvert Cantor, CNM

## 2019-07-08 NOTE — Patient Instructions (Signed)

## 2019-07-10 LAB — AFP, SERUM, OPEN SPINA BIFIDA
AFP MoM: 0.98
AFP Value: 34.4 ng/mL
Gest. Age on Collection Date: 15 weeks
Maternal Age At EDD: 20.5 yr
OSBR Risk 1 IN: 10000
Test Results:: NEGATIVE
Weight: 137 [lb_av]

## 2019-08-05 ENCOUNTER — Other Ambulatory Visit: Payer: Self-pay

## 2019-08-05 ENCOUNTER — Ambulatory Visit (INDEPENDENT_AMBULATORY_CARE_PROVIDER_SITE_OTHER): Payer: Medicaid Other | Admitting: Advanced Practice Midwife

## 2019-08-05 ENCOUNTER — Ambulatory Visit: Payer: Medicaid Other | Attending: Advanced Practice Midwife

## 2019-08-05 VITALS — BP 106/70 | HR 69 | Wt 140.0 lb

## 2019-08-05 DIAGNOSIS — Z3A19 19 weeks gestation of pregnancy: Secondary | ICD-10-CM

## 2019-08-05 DIAGNOSIS — Z3402 Encounter for supervision of normal first pregnancy, second trimester: Secondary | ICD-10-CM

## 2019-08-05 DIAGNOSIS — L309 Dermatitis, unspecified: Secondary | ICD-10-CM

## 2019-08-05 DIAGNOSIS — Z363 Encounter for antenatal screening for malformations: Secondary | ICD-10-CM

## 2019-08-05 DIAGNOSIS — O99712 Diseases of the skin and subcutaneous tissue complicating pregnancy, second trimester: Secondary | ICD-10-CM

## 2019-08-05 MED ORDER — CETIRIZINE HCL 10 MG PO TABS: 10.0000 mg | ORAL_TABLET | Freq: Every day | ORAL | 3 refills | Status: DC

## 2019-08-05 MED ORDER — HYDROCORTISONE 1 % EX LOTN: 1.0000 "application " | TOPICAL_LOTION | Freq: Every day | CUTANEOUS | 0 refills | Status: AC

## 2019-08-05 NOTE — Patient Instructions (Signed)

## 2019-08-05 NOTE — Progress Notes (Signed)
   PRENATAL VISIT NOTE  Subjective:  Amy Graham is a 19 y.o. G1P0 at [redacted]w[redacted]d being seen today for ongoing prenatal care.  She is currently monitored for the following issues for this low-risk pregnancy and has Encounter for supervision of normal first pregnancy in first trimester on their problem list.  Patient reports eczema flare. She endorses widespread eczema rash on both arms with severe itching at night. She has not taken medication or attempted other treatments.  Contractions: Not present. Vag. Bleeding: None.  Movement: Present. Denies leaking of fluid.   The following portions of the patient's history were reviewed and updated as appropriate: allergies, current medications, past family history, past medical history, past social history, past surgical history and problem list. Problem list updated.  Objective:   Vitals:   08/05/19 1005  BP: 106/70  Pulse: 69  Weight: 140 lb (63.5 kg)    Fetal Status: Fetal Heart Rate (bpm): 153   Movement: Present     General:  Alert, oriented and cooperative. Patient is in no acute distress.  Skin: Skin is warm and dry. No rash noted.   Cardiovascular: Normal heart rate noted  Respiratory: Normal respiratory effort, no problems with respiration noted  Abdomen: Soft, gravid, appropriate for gestational age.  Pain/Pressure: Absent     Pelvic: Cervical exam deferred        Extremities: Normal range of motion.  Edema: None  Mental Status: Normal mood and affect. Normal behavior. Normal judgment and thought content.   Assessment and Plan:  Pregnancy: G1P0 at [redacted]w[redacted]d  1. Encounter for supervision of normal first pregnancy in second trimester - Routine care - MFM scan later today  2. Eczema of both upper extremities  - cetirizine (ZYRTEC ALLERGY) 10 MG tablet; Take 1 tablet (10 mg total) by mouth daily.  Dispense: 30 tablet; Refill: 3 - hydrocortisone 1 % lotion; Apply 1 application topically at bedtime for 14 days.  Dispense: 70 mL; Refill:  0  Preterm labor symptoms and general obstetric precautions including but not limited to vaginal bleeding, contractions, leaking of fluid and fetal movement were reviewed in detail with the patient. Please refer to After Visit Summary for other counseling recommendations.  Return in about 4 weeks (around 09/02/2019).  Future Appointments  Date Time Provider Department Center  09/02/2019 10:30 AM Calvert Cantor, CNM [facility]    Calvert Cantor, [place]

## 2019-08-24 ENCOUNTER — Encounter: Payer: Self-pay | Admitting: *Deleted

## 2019-09-02 ENCOUNTER — Ambulatory Visit (INDEPENDENT_AMBULATORY_CARE_PROVIDER_SITE_OTHER): Payer: Medicaid Other | Admitting: Advanced Practice Midwife

## 2019-09-02 ENCOUNTER — Other Ambulatory Visit: Payer: Self-pay

## 2019-09-02 VITALS — BP 114/75 | HR 97 | Wt 147.0 lb

## 2019-09-02 DIAGNOSIS — R102 Pelvic and perineal pain: Secondary | ICD-10-CM

## 2019-09-02 DIAGNOSIS — L309 Dermatitis, unspecified: Secondary | ICD-10-CM

## 2019-09-02 DIAGNOSIS — Z3401 Encounter for supervision of normal first pregnancy, first trimester: Secondary | ICD-10-CM

## 2019-09-02 DIAGNOSIS — O26892 Other specified pregnancy related conditions, second trimester: Secondary | ICD-10-CM

## 2019-09-02 DIAGNOSIS — N949 Unspecified condition associated with female genital organs and menstrual cycle: Secondary | ICD-10-CM

## 2019-09-02 DIAGNOSIS — Z3A23 23 weeks gestation of pregnancy: Secondary | ICD-10-CM

## 2019-09-02 NOTE — Patient Instructions (Addendum)
Second Trimester of Pregnancy  The second trimester is from week 14 through week 27 (month 4 through 6). This is often the time in pregnancy that you feel your best. Often times, morning sickness has lessened or quit. You may have more energy, and you may get hungry more often. Your unborn baby is growing rapidly. At the end of the sixth month, he or she is about 9 inches long and weighs about 1 pounds. You will likely feel the baby move between 18 and 20 weeks of pregnancy. Follow these instructions at home: Medicines  Take over-the-counter and prescription medicines only as told by your doctor. Some medicines are safe and some medicines are not safe during pregnancy.  Take a prenatal vitamin that contains at least 600 micrograms (mcg) of folic acid.  If you have trouble pooping (constipation), take medicine that will make your stool soft (stool softener) if your doctor approves. Eating and drinking   Eat regular, healthy meals.  Avoid raw meat and uncooked cheese.  If you get low calcium from the food you eat, talk to your doctor about taking a daily calcium supplement.  Avoid foods that are high in fat and sugars, such as fried and sweet foods.  If you feel sick to your stomach (nauseous) or throw up (vomit): ? Eat 4 or 5 small meals a day instead of 3 large meals. ? Try eating a few soda crackers. ? Drink liquids between meals instead of during meals.  To prevent constipation: ? Eat foods that are high in fiber, like fresh fruits and vegetables, whole grains, and beans. ? Drink enough fluids to keep your pee (urine) clear or pale yellow. Activity  Exercise only as told by your doctor. Stop exercising if you start to have cramps.  Do not exercise if it is too hot, too humid, or if you are in a place of great height (high altitude).  Avoid heavy lifting.  Wear low-heeled shoes. Sit and stand up straight.  You can continue to have sex unless your doctor tells you not  to. Relieving pain and discomfort  Wear a good support bra if your breasts are tender.  Take warm water baths (sitz baths) to soothe pain or discomfort caused by hemorrhoids. Use hemorrhoid cream if your doctor approves.  Rest with your legs raised if you have leg cramps or low back pain.  If you develop puffy, bulging veins (varicose veins) in your legs: ? Wear support hose or compression stockings as told by your doctor. ? Raise (elevate) your feet for 15 minutes, 3-4 times a day. ? Limit salt in your food. Prenatal care  Write down your questions. Take them to your prenatal visits.  Keep all your prenatal visits as told by your doctor. This is important. Safety  Wear your seat belt when driving.  Make a list of emergency phone numbers, including numbers for family, friends, the hospital, and police and fire departments. General instructions  Ask your doctor about the right foods to eat or for help finding a counselor, if you need these services.  Ask your doctor about local prenatal classes. Begin classes before month 6 of your pregnancy.  Do not use hot tubs, steam rooms, or saunas.  Do not douche or use tampons or scented sanitary pads.  Do not cross your legs for long periods of time.  Visit your dentist if you have not done so. Use a soft toothbrush to brush your teeth. Floss gently.  Avoid all smoking, herbs,   and alcohol. Avoid drugs that are not approved by your doctor.  Do not use any products that contain nicotine or tobacco, such as cigarettes and e-cigarettes. If you need help quitting, ask your doctor.  Avoid cat litter boxes and soil used by cats. These carry germs that can cause birth defects in the baby and can cause a loss of your baby (miscarriage) or stillbirth. Contact a doctor if:  You have mild cramps or pressure in your lower belly.  You have pain when you pee (urinate).  You have bad smelling fluid coming from your vagina.  You continue to  feel sick to your stomach (nauseous), throw up (vomit), or have watery poop (diarrhea).  You have a nagging pain in your belly area.  You feel dizzy. Get help right away if:  You have a fever.  You are leaking fluid from your vagina.  You have spotting or bleeding from your vagina.  You have severe belly cramping or pain.  You lose or gain weight rapidly.  You have trouble catching your breath and have chest pain.  You notice sudden or extreme puffiness (swelling) of your face, hands, ankles, feet, or legs.  You have not felt the baby move in over an hour.  You have severe headaches that do not go away when you take medicine.  You have trouble seeing. Summary  The second trimester is from week 14 through week 27 (months 4 through 6). This is often the time in pregnancy that you feel your best.  To take care of yourself and your unborn baby, you will need to eat healthy meals, take medicines only if your doctor tells you to do so, and do activities that are safe for you and your baby.  Call your doctor if you get sick or if you notice anything unusual about your pregnancy. Also, call your doctor if you need help with the right food to eat, or if you want to know what activities are safe for you. This information is not intended to replace advice given to you by your health care provider. Make sure you discuss any questions you have with your health care provider. Document Revised: 04/25/2018 Document Reviewed: 02/07/2016 Elsevier Patient Education  2020 Elsevier Inc.    Glucose Tolerance Test During Pregnancy Why am I having this test? The glucose tolerance test (GTT) is done to check how your body processes sugar (glucose). This is one of several tests used to diagnose diabetes that develops during pregnancy (gestational diabetes mellitus). Gestational diabetes is a temporary form of diabetes that some women develop during pregnancy. It usually occurs during the second  trimester of pregnancy and goes away after delivery. Testing (screening) for gestational diabetes usually occurs between 24 and 28 weeks of pregnancy. You may have the GTT test after having a 1-hour glucose screening test if the results from that test indicate that you may have gestational diabetes. You may also have this test if:  You have a history of gestational diabetes.  You have a history of giving birth to very large babies or have experienced repeated fetal loss (stillbirth).  You have signs and symptoms of diabetes, such as: ? Changes in your vision. ? Tingling or numbness in your hands or feet. ? Changes in hunger, thirst, and urination that are not otherwise explained by your pregnancy. What is being tested? This test measures the amount of glucose in your blood at different times during a period of 3 hours. This indicates how well   your body is able to process glucose. What kind of sample is taken?  Blood samples are required for this test. They are usually collected by inserting a needle into a blood vessel. How do I prepare for this test?  For 3 days before your test, eat normally. Have plenty of carbohydrate-rich foods.  Follow instructions from your health care provider about: ? Eating or drinking restrictions on the day of the test. You may be asked to not eat or drink anything other than water (fast) starting 8-10 hours before the test. ? Changing or stopping your regular medicines. Some medicines may interfere with this test. Tell a health care provider about:  All medicines you are taking, including vitamins, herbs, eye drops, creams, and over-the-counter medicines.  Any blood disorders you have.  Any surgeries you have had.  Any medical conditions you have. What happens during the test? First, your blood glucose will be measured. This is referred to as your fasting blood glucose, since you fasted before the test. Then, you will drink a glucose solution that  contains a certain amount of glucose. Your blood glucose will be measured again 1, 2, and 3 hours after drinking the solution. This test takes about 3 hours to complete. You will need to stay at the testing location during this time. During the testing period:  Do not eat or drink anything other than the glucose solution.  Do not exercise.  Do not use any products that contain nicotine or tobacco, such as cigarettes and e-cigarettes. If you need help stopping, ask your health care provider. The testing procedure may vary among health care providers and hospitals. How are the results reported? Your results will be reported as milligrams of glucose per deciliter of blood (mg/dL) or millimoles per liter (mmol/L). Your health care provider will compare your results to normal ranges that were established after testing a large group of people (reference ranges). Reference ranges may vary among labs and hospitals. For this test, common reference ranges are:  Fasting: less than 95-105 mg/dL (5.3-5.8 mmol/L).  1 hour after drinking glucose: less than 180-190 mg/dL (10.0-10.5 mmol/L).  2 hours after drinking glucose: less than 155-165 mg/dL (8.6-9.2 mmol/L).  3 hours after drinking glucose: 140-145 mg/dL (7.8-8.1 mmol/L). What do the results mean? Results within reference ranges are considered normal, meaning that your glucose levels are well-controlled. If two or more of your blood glucose levels are high, you may be diagnosed with gestational diabetes. If only one level is high, your health care provider may suggest repeat testing or other tests to confirm a diagnosis. Talk with your health care provider about what your results mean. Questions to ask your health care provider Ask your health care provider, or the department that is doing the test:  When will my results be ready?  How will I get my results?  What are my treatment options?  What other tests do I need?  What are my next  steps? Summary  The glucose tolerance test (GTT) is one of several tests used to diagnose diabetes that develops during pregnancy (gestational diabetes mellitus). Gestational diabetes is a temporary form of diabetes that some women develop during pregnancy.  You may have the GTT test after having a 1-hour glucose screening test if the results from that test indicate that you may have gestational diabetes. You may also have this test if you have any symptoms or risk factors for gestational diabetes.  Talk with your health care provider about what your   results mean. This information is not intended to replace advice given to you by your health care provider. Make sure you discuss any questions you have with your health care provider. Document Revised: 04/24/2018 Document Reviewed: 08/13/2016 Elsevier Patient Education  2020 Elsevier Inc.  

## 2019-09-02 NOTE — Progress Notes (Signed)
   PRENATAL VISIT NOTE  Subjective:  Amy Graham is a 19 y.o. G1P0 at [redacted]w[redacted]d being seen today for ongoing prenatal care.  She is currently monitored for the following issues for this low-risk pregnancy and has Encounter for supervision of normal first pregnancy in first trimester on their problem list.  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 She denies urinary symptoms.  Contractions: Not present. Vag. Bleeding: None.  Movement: Present. Denies leaking of fluid.   The following portions of the patient's history were reviewed and updated as appropriate: allergies, current medications, past family history, past medical history, past social history, past surgical history and problem list. Problem list updated.  Objective:   Vitals:   09/02/19 1030  BP: 114/75  Pulse: 97  Weight: 147 lb (66.7 kg)    Fetal Status: Fetal Heart Rate (bpm): 145   Movement: Present     General:  Alert, oriented and cooperative. Patient is in no acute distress.  Skin: Skin is warm and dry. No rash noted.   Cardiovascular: Normal heart rate noted  Respiratory: Normal respiratory effort, no problems with respiration noted  Abdomen: Soft, gravid, appropriate for gestational age.  Pain/Pressure: Present     Pelvic: Cervical exam deferred        Extremities: Normal range of motion.  Edema: None  Mental Status: Normal mood and affect. Normal behavior. Normal judgment and thought content.   Assessment and Plan:  Pregnancy: G1P0 at [redacted]w[redacted]d  1. Encounter for supervision of normal first pregnancy in first trimester - Routine care - Discussed GTT next visit, interventions for abnormal reading - Discussed postplacental IUD vs postpartum IUD  2. Pelvic pressure in pregnancy, antepartum, second trimester - Encouraged maternity belt, compressions socks, rest breaks especially during work shift  3. Eczema of both upper extremities - Resolved with previously prescribed  medication/interventions  Preterm labor symptoms and general obstetric precautions including but not limited to vaginal bleeding, contractions, leaking of fluid and fetal movement were reviewed in detail with the patient. Please refer to After Visit Summary for other counseling recommendations.  Return in about 4 weeks (around 09/30/2019).  Future Appointments  Date Time Provider Department Center  09/29/2019  9:15 AM Anyanwu, Jethro Bastos, [place] [facility]    Calvert Cantor, CNM

## 2019-09-29 ENCOUNTER — Other Ambulatory Visit: Payer: Self-pay

## 2019-09-29 ENCOUNTER — Ambulatory Visit (INDEPENDENT_AMBULATORY_CARE_PROVIDER_SITE_OTHER): Payer: Medicaid Other | Admitting: Obstetrics & Gynecology

## 2019-09-29 VITALS — BP 97/63 | HR 85 | Wt 150.6 lb

## 2019-09-29 DIAGNOSIS — Z3402 Encounter for supervision of normal first pregnancy, second trimester: Secondary | ICD-10-CM

## 2019-09-29 DIAGNOSIS — Z3A26 26 weeks gestation of pregnancy: Secondary | ICD-10-CM | POA: Diagnosis not present

## 2019-09-29 DIAGNOSIS — Z23 Encounter for immunization: Secondary | ICD-10-CM | POA: Diagnosis not present

## 2019-09-29 LAB — CBC
Hematocrit: 34 % (ref 34.0–46.6)
Hemoglobin: 11.2 g/dL (ref 11.1–15.9)
MCH: 30.1 pg (ref 26.6–33.0)
MCHC: 32.9 g/dL (ref 31.5–35.7)
MCV: 91 fL (ref 79–97)
Platelets: 209 10*3/uL (ref 150–450)
RBC: 3.72 x10E6/uL — ABNORMAL LOW (ref 3.77–5.28)
RDW: 12.8 % (ref 11.7–15.4)
WBC: 6.4 10*3/uL (ref 3.4–10.8)

## 2019-09-29 NOTE — Patient Instructions (Signed)
Return to office for any scheduled appointments. Call the office or go to the MAU at [facility] [place] if:  You begin to have strong, frequent contractions  Your water breaks.  Sometimes it is a big gush of fluid, sometimes it is just a trickle that keeps getting your panties wet or running down your legs  You have vaginal bleeding.  It is normal to have a small amount of spotting if your cervix was checked.   You do not feel your baby moving like normal.  If you do not, get something to eat and drink and lay down and focus on feeling your baby move.   If your baby is still not moving like normal, you should call the office or go to MAU.  Any other obstetric concerns.  TDaP Vaccine Pregnancy Get the Whooping Cough Vaccine While You Are Pregnant (CDC)  It is important for women to get the whooping cough vaccine in the third trimester of each pregnancy. Vaccines are the best way to prevent this disease. There are 2 different whooping cough vaccines. Both vaccines combine protection against whooping cough, tetanus and diphtheria, but they are for different age groups: Tdap: for everyone 11 years or older, including pregnant women  DTaP: for children 2 months through 6 years of age  You need the whooping cough vaccine during each of your pregnancies The recommended time to get the shot is during your 27th through 36th week of pregnancy, preferably during the earlier part of this time period. The Centers for Disease Control and Prevention (CDC) recommends that pregnant women receive the whooping cough vaccine for adolescents and adults (called Tdap vaccine) during the third trimester of each pregnancy. The recommended time to get the shot is during your 27th through 36th week of pregnancy, preferably during the earlier part of this time period. This replaces the original recommendation that pregnant women get the vaccine only if they had not previously received it. The  American College of Obstetricians and Gynecologists and the American College of Nurse-Midwives support this recommendation.  You should get the whooping cough vaccine while pregnant to pass protection to your baby frame support disabled and/or not supported in this browser  Learn why Laura decided to get the whooping cough vaccine in her 3rd trimester of pregnancy and how her baby girl was born with some protection against the disease. Also available on YouTube. After receiving the whooping cough vaccine, your body will create protective antibodies (proteins produced by the body to fight off diseases) and pass some of them to your baby before birth. These antibodies provide your baby some short-term protection against whooping cough in early life. These antibodies can also protect your baby from some of the more serious complications that come along with whooping cough. Your protective antibodies are at their highest about 2 weeks after getting the vaccine, but it takes time to pass them to your baby. So the preferred time to get the whooping cough vaccine is early in your third trimester. The amount of whooping cough antibodies in your body decreases over time. That is why CDC recommends you get a whooping cough vaccine during each pregnancy. Doing so allows each of your babies to get the greatest number of protective antibodies from you. This means each of your babies will get the best protection possible against this disease.  Getting the whooping cough vaccine while pregnant is better than getting the vaccine after you give birth Whooping cough vaccination during   pregnancy is ideal so your baby will have short-term protection as soon as he is born. This early protection is important because your baby will not start getting his whooping cough vaccines until he is 2 months old. These first few months of life are when your baby is at greatest risk for catching whooping cough. This is also when he's at  greatest risk for having severe, potentially life-threating complications from the infection. To avoid that gap in protection, it is best to get a whooping cough vaccine during pregnancy. You will then pass protection to your baby before he is born. To continue protecting your baby, he should get whooping cough vaccines starting at 2 months old. You may never have gotten the Tdap vaccine before and did not get it during this pregnancy. If so, you should make sure to get the vaccine immediately after you give birth, before leaving the hospital or birthing center. It will take about 2 weeks before your body develops protection (antibodies) in response to the vaccine. Once you have protection from the vaccine, you are less likely to give whooping cough to your newborn while caring for him. But remember, your baby will still be at risk for catching whooping cough from others. A recent study looked to see how effective Tdap was at preventing whooping cough in babies whose mothers got the vaccine while pregnant or in the hospital after giving birth. The study found that getting Tdap between 27 through 36 weeks of pregnancy is 85% more effective at preventing whooping cough in babies younger than 2 months old. Blood tests cannot tell if you need a whooping cough vaccine There are no blood tests that can tell you if you have enough antibodies in your body to protect yourself or your baby against whooping cough. Even if you have been sick with whooping cough in the past or previously received the vaccine, you still should get the vaccine during each pregnancy. Breastfeeding may pass some protective antibodies onto your baby By breastfeeding, you may pass some antibodies you have made in response to the vaccine to your baby. When you get a whooping cough vaccine during your pregnancy, you will have antibodies in your breast milk that you can share with your baby as soon as your milk comes in. However, your baby will not  get protective antibodies immediately if you wait to get the whooping cough vaccine until after delivering your baby. This is because it takes about 2 weeks for your body to create antibodies. Learn more about the health benefits of breastfeeding.  

## 2019-09-29 NOTE — Progress Notes (Signed)
   PRENATAL VISIT NOTE  Subjective:  Amy Graham is a 19 y.o. G1P0 at [redacted]w[redacted]d being seen today for ongoing prenatal care.  She is currently monitored for the following issues for this low-risk pregnancy and has Encounter for supervision of normal first pregnancy in second trimester on their problem list.  Patient reports no complaints.  Contractions: Not present. Vag. Bleeding: None.  Movement: Present. Denies leaking of fluid.   The following portions of the patient's history were reviewed and updated as appropriate: allergies, current medications, past family history, past medical history, past social history, past surgical history and problem list.   Objective:   Vitals:   09/29/19 0906  BP: 97/63  Pulse: 85  Weight: 150 lb 9.6 oz (68.3 kg)    Fetal Status: Fetal Heart Rate (bpm): 151 Fundal Height: 26 cm Movement: Present     General:  Alert, oriented and cooperative. Patient is in no acute distress.  Skin: Skin is warm and dry. No rash noted.   Cardiovascular: Normal heart rate noted  Respiratory: Normal respiratory effort, no problems with respiration noted  Abdomen: Soft, gravid, appropriate for gestational age.  Pain/Pressure: Present     Pelvic: Cervical exam deferred        Extremities: Normal range of motion.  Edema: None  Mental Status: Normal mood and affect. Normal behavior. Normal judgment and thought content.   Assessment and Plan:  Pregnancy: G1P0 at [redacted]w[redacted]d 1. [redacted] weeks gestation of pregnancy 2. Encounter for supervision of normal first pregnancy in second trimester Labs next visit. F:u shot today.  - Flu Vaccine QUAD 36+ mos IM (Fluarix, Quad PF) - CBC; Future - RPR; Future - HIV Antibody (routine testing w rflx); Future - Glucose Tolerance, 2 Hours w/1 Hour; Future Preterm labor symptoms and general obstetric precautions including but not limited to vaginal bleeding, contractions, leaking of fluid and fetal movement were reviewed in detail with the  patient. Please refer to After Visit Summary for other counseling recommendations.   Return in about 2 weeks (around 10/13/2019) for 2 hr GTT, 3rd trimester labs, Tdap, OFFICE OB Visit.  Future Appointments  Date Time Provider Department Center  09/30/2019  8:30 AM [facility]    Jaynie Collins, MD

## 2019-09-30 ENCOUNTER — Other Ambulatory Visit: Payer: Medicaid Other

## 2019-09-30 LAB — RPR: RPR Ser Ql: NONREACTIVE

## 2019-09-30 LAB — HIV ANTIBODY (ROUTINE TESTING W REFLEX): HIV Screen 4th Generation wRfx: NONREACTIVE

## 2019-10-15 ENCOUNTER — Other Ambulatory Visit: Payer: Self-pay

## 2019-10-15 ENCOUNTER — Ambulatory Visit (INDEPENDENT_AMBULATORY_CARE_PROVIDER_SITE_OTHER): Payer: Medicaid Other | Admitting: Obstetrics & Gynecology

## 2019-10-15 VITALS — BP 114/78 | HR 74 | Wt 155.0 lb

## 2019-10-15 DIAGNOSIS — Z3403 Encounter for supervision of normal first pregnancy, third trimester: Secondary | ICD-10-CM

## 2019-10-15 DIAGNOSIS — Z23 Encounter for immunization: Secondary | ICD-10-CM

## 2019-10-15 DIAGNOSIS — Z3A29 29 weeks gestation of pregnancy: Secondary | ICD-10-CM

## 2019-10-15 LAB — CBC
Hematocrit: 31.9 % — ABNORMAL LOW (ref 34.0–46.6)
Hemoglobin: 11.1 g/dL (ref 11.1–15.9)
MCH: 31 pg (ref 26.6–33.0)
MCHC: 34.8 g/dL (ref 31.5–35.7)
MCV: 89 fL (ref 79–97)
Platelets: 216 10*3/uL (ref 150–450)
RBC: 3.58 x10E6/uL — ABNORMAL LOW (ref 3.77–5.28)
RDW: 12.3 % (ref 11.7–15.4)
WBC: 7.2 10*3/uL (ref 3.4–10.8)

## 2019-10-15 NOTE — Patient Instructions (Signed)
Return to office for any scheduled appointments. Call the office or go to the MAU at [facility] [place] if:  You begin to have strong, frequent contractions  Your water breaks.  Sometimes it is a big gush of fluid, sometimes it is just a trickle that keeps getting your panties wet or running down your legs  You have vaginal bleeding.  It is normal to have a small amount of spotting if your cervix was checked.   You do not feel your baby moving like normal.  If you do not, get something to eat and drink and lay down and focus on feeling your baby move.   If your baby is still not moving like normal, you should call the office or go to MAU.  Any other obstetric concerns.   Third Trimester of Pregnancy The third trimester is from week 28 through week 40 (months 7 through 9). The third trimester is a time when the unborn baby (fetus) is growing rapidly. At the end of the ninth month, the fetus is about 20 inches in length and weighs 6-10 pounds. Body changes during your third trimester Your body will continue to go through many changes during pregnancy. The changes vary from woman to woman. During the third trimester:  Your weight will continue to increase. You can expect to gain 25-35 pounds (11-16 kg) by the end of the pregnancy.  You may begin to get stretch marks on your hips, abdomen, and breasts.  You may urinate more often because the fetus is moving lower into your pelvis and pressing on your bladder.  You may develop or continue to have heartburn. This is caused by increased hormones that slow down muscles in the digestive tract.  You may develop or continue to have constipation because increased hormones slow digestion and cause the muscles that push waste through your intestines to relax.  You may develop hemorrhoids. These are swollen veins (varicose veins) in the rectum that can itch or be painful.  You may develop swollen, bulging veins (varicose veins)  in your legs.  You may have increased body aches in the pelvis, back, or thighs. This is due to weight gain and increased hormones that are relaxing your joints.  You may have changes in your hair. These can include thickening of your hair, rapid growth, and changes in texture. Some women also have hair loss during or after pregnancy, or hair that feels dry or thin. Your hair will most likely return to normal after your baby is born.  Your breasts will continue to grow and they will continue to become tender. A yellow fluid (colostrum) may leak from your breasts. This is the first milk you are producing for your baby.  Your belly button may stick out.  You may notice more swelling in your hands, face, or ankles.  You may have increased tingling or numbness in your hands, arms, and legs. The skin on your belly may also feel numb.  You may feel short of breath because of your expanding uterus.  You may have more problems sleeping. This can be caused by the size of your belly, increased need to urinate, and an increase in your body's metabolism.  You may notice the fetus "dropping," or moving lower in your abdomen (lightening).  You may have increased vaginal discharge.  You may notice your joints feel loose and you may have pain around your pelvic bone. What to expect at prenatal visits You will have   prenatal exams every 2 weeks until week 36. Then you will have weekly prenatal exams. During a routine prenatal visit:  You will be weighed to make sure you and the baby are growing normally.  Your blood pressure will be taken.  Your abdomen will be measured to track your baby's growth.  The fetal heartbeat will be listened to.  Any test results from the previous visit will be discussed.  You may have a cervical check near your due date to see if your cervix has softened or thinned (effaced).  You will be tested for Group B streptococcus. This happens between 35 and 37 weeks. Your  health care provider may ask you:  What your birth plan is.  How you are feeling.  If you are feeling the baby move.  If you have had any abnormal symptoms, such as leaking fluid, bleeding, severe headaches, or abdominal cramping.  If you are using any tobacco products, including cigarettes, chewing tobacco, and electronic cigarettes.  If you have any questions. Other tests or screenings that may be performed during your third trimester include:  Blood tests that check for low iron levels (anemia).  Fetal testing to check the health, activity level, and growth of the fetus. Testing is done if you have certain medical conditions or if there are problems during the pregnancy.  Nonstress test (NST). This test checks the health of your baby to make sure there are no signs of problems, such as the baby not getting enough oxygen. During this test, a belt is placed around your belly. The baby is made to move, and its heart rate is monitored during movement. What is false labor? False labor is a condition in which you feel small, irregular tightenings of the muscles in the womb (contractions) that usually go away with rest, changing position, or drinking water. These are called Braxton Hicks contractions. Contractions may last for hours, days, or even weeks before true labor sets in. If contractions come at regular intervals, become more frequent, increase in intensity, or become painful, you should see your health care provider. What are the signs of labor?  Abdominal cramps.  Regular contractions that start at 10 minutes apart and become stronger and more frequent with time.  Contractions that start on the top of the uterus and spread down to the lower abdomen and back.  Increased pelvic pressure and dull back pain.  A watery or bloody mucus discharge that comes from the vagina.  Leaking of amniotic fluid. This is also known as your "water breaking." It could be a slow trickle or a gush.  Let your health care provider know if it has a color or strange odor. If you have any of these signs, call your health care provider right away, even if it is before your due date. Follow these instructions at home: Medicines  Follow your health care provider's instructions regarding medicine use. Specific medicines may be either safe or unsafe to take during pregnancy.  Take a prenatal vitamin that contains at least 600 micrograms (mcg) of folic acid.  If you develop constipation, try taking a stool softener if your health care provider approves. Eating and drinking   Eat a balanced diet that includes fresh fruits and vegetables, whole grains, good sources of protein such as meat, eggs, or tofu, and low-fat dairy. Your health care provider will help you determine the amount of weight gain that is right for you.  Avoid raw meat and uncooked cheese. These carry germs that   can cause birth defects in the baby.  If you have low calcium intake from food, talk to your health care provider about whether you should take a daily calcium supplement.  Eat four or five small meals rather than three large meals a day.  Limit foods that are high in fat and processed sugars, such as fried and sweet foods.  To prevent constipation: ? Drink enough fluid to keep your urine clear or pale yellow. ? Eat foods that are high in fiber, such as fresh fruits and vegetables, whole grains, and beans. Activity  Exercise only as directed by your health care provider. Most women can continue their usual exercise routine during pregnancy. Try to exercise for 30 minutes at least 5 days a week. Stop exercising if you experience uterine contractions.  Avoid heavy lifting.  Do not exercise in extreme heat or humidity, or at high altitudes.  Wear low-heel, comfortable shoes.  Practice good posture.  You may continue to have sex unless your health care provider tells you otherwise. Relieving pain and  discomfort  Take frequent breaks and rest with your legs elevated if you have leg cramps or low back pain.  Take warm sitz baths to soothe any pain or discomfort caused by hemorrhoids. Use hemorrhoid cream if your health care provider approves.  Wear a good support bra to prevent discomfort from breast tenderness.  If you develop varicose veins: ? Wear support pantyhose or compression stockings as told by your healthcare provider. ? Elevate your feet for 15 minutes, 3-4 times a day. Prenatal care  Write down your questions. Take them to your prenatal visits.  Keep all your prenatal visits as told by your health care provider. This is important. Safety  Wear your seat belt at all times when driving.  Make a list of emergency phone numbers, including numbers for family, friends, the hospital, and police and fire departments. General instructions  Avoid cat litter boxes and soil used by cats. These carry germs that can cause birth defects in the baby. If you have a cat, ask someone to clean the litter box for you.  Do not travel far distances unless it is absolutely necessary and only with the approval of your health care provider.  Do not use hot tubs, steam rooms, or saunas.  Do not drink alcohol.  Do not use any products that contain nicotine or tobacco, such as cigarettes and e-cigarettes. If you need help quitting, ask your health care provider.  Do not use any medicinal herbs or unprescribed drugs. These chemicals affect the formation and growth of the baby.  Do not douche or use tampons or scented sanitary pads.  Do not cross your legs for long periods of time.  To prepare for the arrival of your baby: ? Take prenatal classes to understand, practice, and ask questions about labor and delivery. ? Make a trial run to the hospital. ? Visit the hospital and tour the maternity area. ? Arrange for maternity or paternity leave through employers. ? Arrange for family and  friends to take care of pets while you are in the hospital. ? Purchase a rear-facing car seat and make sure you know how to install it in your car. ? Pack your hospital bag. ? Prepare the baby's nursery. Make sure to remove all pillows and stuffed animals from the baby's crib to prevent suffocation.  Visit your dentist if you have not gone during your pregnancy. Use a soft toothbrush to brush your teeth and be   gentle when you floss. Contact a health care provider if:  You are unsure if you are in labor or if your water has broken.  You become dizzy.  You have mild pelvic cramps, pelvic pressure, or nagging pain in your abdominal area.  You have lower back pain.  You have persistent nausea, vomiting, or diarrhea.  You have an unusual or bad smelling vaginal discharge.  You have pain when you urinate. Get help right away if:  Your water breaks before 37 weeks.  You have regular contractions less than 5 minutes apart before 37 weeks.  You have a fever.  You are leaking fluid from your vagina.  You have spotting or bleeding from your vagina.  You have severe abdominal pain or cramping.  You have rapid weight loss or weight gain.  You have shortness of breath with chest pain.  You notice sudden or extreme swelling of your face, hands, ankles, feet, or legs.  Your baby makes fewer than 10 movements in 2 hours.  You have severe headaches that do not go away when you take medicine.  You have vision changes. Summary  The third trimester is from week 28 through week 40, months 7 through 9. The third trimester is a time when the unborn baby (fetus) is growing rapidly.  During the third trimester, your discomfort may increase as you and your baby continue to gain weight. You may have abdominal, leg, and back pain, sleeping problems, and an increased need to urinate.  During the third trimester your breasts will keep growing and they will continue to become tender. A yellow  fluid (colostrum) may leak from your breasts. This is the first milk you are producing for your baby.  False labor is a condition in which you feel small, irregular tightenings of the muscles in the womb (contractions) that eventually go away. These are called Braxton Hicks contractions. Contractions may last for hours, days, or even weeks before true labor sets in.  Signs of labor can include: abdominal cramps; regular contractions that start at 10 minutes apart and become stronger and more frequent with time; watery or bloody mucus discharge that comes from the vagina; increased pelvic pressure and dull back pain; and leaking of amniotic fluid. This information is not intended to replace advice given to you by your health care provider. Make sure you discuss any questions you have with your health care provider. Document Revised: 04/24/2018 Document Reviewed: 02/07/2016 Elsevier Patient Education  2020 [employer]  

## 2019-10-15 NOTE — Progress Notes (Signed)
   PRENATAL VISIT NOTE  Subjective:  Amy Graham is a 19 y.o. G1P0 at [redacted]w[redacted]d being seen today for ongoing prenatal care.  She is currently monitored for the following issues for this low-risk pregnancy and has Encounter for supervision of normal first pregnancy in third trimester on their problem list.  Patient reports no complaints.  Contractions: Not present. Vag. Bleeding: None.  Movement: Present. Denies leaking of fluid.   The following portions of the patient's history were reviewed and updated as appropriate: allergies, current medications, past family history, past medical history, past social history, past surgical history and problem list.   Objective:   Vitals:   10/15/19 0859  BP: 114/78  Pulse: 74  Weight: 155 lb (70.3 kg)    Fetal Status: Fetal Heart Rate (bpm): 142 Fundal Height: 29 cm Movement: Present     General:  Alert, oriented and cooperative. Patient is in no acute distress.  Skin: Skin is warm and dry. No rash noted.   Cardiovascular: Normal heart rate noted  Respiratory: Normal respiratory effort, no problems with respiration noted  Abdomen: Soft, gravid, appropriate for gestational age.  Pain/Pressure: Absent     Pelvic: Cervical exam deferred        Extremities: Normal range of motion.  Edema: Trace  Mental Status: Normal mood and affect. Normal behavior. Normal judgment and thought content.   Assessment and Plan:  Pregnancy: G1P0 at [redacted]w[redacted]d 1. [redacted] weeks gestation of pregnancy 2. Encounter for supervision of normal first pregnancy in third trimester Third trimester labs done today. Tdap done.  - Glucose Tolerance, 2 Hours w/1 Hour - CBC - RPR - HIV Antibody (routine testing w rflx) Preterm labor symptoms and general obstetric precautions including but not limited to vaginal bleeding, contractions, leaking of fluid and fetal movement were reviewed in detail with the patient. Please refer to After Visit Summary for other counseling recommendations.    Return in about 2 weeks (around 10/29/2019) for OFFICE OB Visit.  No future appointments.  Jaynie Collins, MD

## 2019-10-16 LAB — GLUCOSE TOLERANCE, 2 HOURS W/ 1HR
Glucose, 1 hour: 119 mg/dL (ref 65–179)
Glucose, 2 hour: 112 mg/dL (ref 65–152)
Glucose, Fasting: 72 mg/dL (ref 65–91)

## 2019-10-16 LAB — HIV ANTIBODY (ROUTINE TESTING W REFLEX): HIV Screen 4th Generation wRfx: NONREACTIVE

## 2019-10-16 LAB — RPR: RPR Ser Ql: NONREACTIVE

## 2019-10-29 ENCOUNTER — Ambulatory Visit (INDEPENDENT_AMBULATORY_CARE_PROVIDER_SITE_OTHER): Payer: Medicaid Other | Admitting: Obstetrics & Gynecology

## 2019-10-29 ENCOUNTER — Other Ambulatory Visit: Payer: Self-pay

## 2019-10-29 VITALS — BP 105/70 | HR 81 | Wt 152.0 lb

## 2019-10-29 DIAGNOSIS — Z3A31 31 weeks gestation of pregnancy: Secondary | ICD-10-CM

## 2019-10-29 DIAGNOSIS — L309 Dermatitis, unspecified: Secondary | ICD-10-CM

## 2019-10-29 DIAGNOSIS — Z3403 Encounter for supervision of normal first pregnancy, third trimester: Secondary | ICD-10-CM

## 2019-10-29 LAB — POCT URINALYSIS DIPSTICK: Blood, UA: NEGATIVE

## 2019-10-29 MED ORDER — TRIAMCINOLONE ACETONIDE 0.5 % EX CREA: 1.0000 "application " | TOPICAL_CREAM | Freq: Two times a day (BID) | CUTANEOUS | 2 refills | Status: DC

## 2019-10-29 MED ORDER — SARNA 0.5-0.5 % EX LOTN: 1.0000 "application " | TOPICAL_LOTION | CUTANEOUS | 0 refills | Status: DC | PRN

## 2019-10-29 NOTE — Progress Notes (Signed)
   PRENATAL VISIT NOTE  Subjective:  Amy Graham is a 19 y.o. G1P0 at [redacted]w[redacted]d being seen today for ongoing prenatal care.  She is currently monitored for the following issues for this low-risk pregnancy and has Encounter for supervision of normal first pregnancy in third trimester on their problem list.  Patient reports eczema on her shoulders, back of neck and upper arms. Very itchy. Had an episode of burning with urination a few days ago, not currently.  Contractions: Not present. Vag. Bleeding: None.  Movement: Present. Denies leaking of fluid.   The following portions of the patient's history were reviewed and updated as appropriate: allergies, current medications, past family history, past medical history, past social history, past surgical history and problem list.   Objective:   Vitals:   10/29/19 1113  BP: 105/70  Pulse: 81  Weight: 152 lb (68.9 kg)    Fetal Status: Fetal Heart Rate (bpm): 155   Movement: Present     General:  Alert, oriented and cooperative. Patient is in no acute distress.  Skin: Skin is warm and dry. Eczematous rash noted on upper extremities and back of neck  Cardiovascular: Normal heart rate noted  Respiratory: Normal respiratory effort, no problems with respiration noted  Abdomen: Soft, gravid, appropriate for gestational age.  Pain/Pressure: Absent     Pelvic: Cervical exam deferred        Extremities: Normal range of motion.  Edema: None  Mental Status: Normal mood and affect. Normal behavior. Normal judgment and thought content.   Assessment and Plan:  Pregnancy: G1P0 at [redacted]w[redacted]d 1. Eczema of both upper extremities Medications prescribed for eczema, will monitor response. - triamcinolone cream (KENALOG) 0.5 %; Apply 1 application topically 2 (two) times daily.  Dispense: 454 g; Refill: 2 - camphor-menthol (SARNA) lotion; Apply 1 application topically as needed for itching.  Dispense: 222 mL; Refill: 0  2. [redacted] weeks gestation of pregnancy 3.  Encounter for supervision of normal first pregnancy in third trimester Will follow up UTI evaluation studies and manage accordingly. Currently asymptomatic. - POCT Urinalysis Dipstick - Culture, OB Urine Preterm labor symptoms and general obstetric precautions including but not limited to vaginal bleeding, contractions, leaking of fluid and fetal movement were reviewed in detail with the patient. Please refer to After Visit Summary for other counseling recommendations.   Return in about 2 weeks (around 11/12/2019) for OFFICE OB Visit.  Future Appointments  Date Time Provider Department Center  11/11/2019  9:45 AM Briant Sites [facility]  11/26/2019 11:00 AM [place] Bing, MD [facility]    Jaynie Collins, MD

## 2019-10-29 NOTE — Patient Instructions (Signed)
Return to office for any scheduled appointments. Call the office or go to the MAU at [facility] [place] if:  You begin to have strong, frequent contractions  Your water breaks.  Sometimes it is a big gush of fluid, sometimes it is just a trickle that keeps getting your panties wet or running down your legs  You have vaginal bleeding.  It is normal to have a small amount of spotting if your cervix was checked.   You do not feel your baby moving like normal.  If you do not, get something to eat and drink and lay down and focus on feeling your baby move.   If your baby is still not moving like normal, you should call the office or go to MAU.  Any other obstetric concerns.   

## 2019-10-31 LAB — URINE CULTURE, OB REFLEX

## 2019-10-31 LAB — CULTURE, OB URINE

## 2019-11-11 ENCOUNTER — Ambulatory Visit (INDEPENDENT_AMBULATORY_CARE_PROVIDER_SITE_OTHER): Payer: Medicaid Other | Admitting: Advanced Practice Midwife

## 2019-11-11 ENCOUNTER — Other Ambulatory Visit: Payer: Self-pay

## 2019-11-11 VITALS — BP 109/74 | HR 87 | Wt 154.0 lb

## 2019-11-11 DIAGNOSIS — Z3403 Encounter for supervision of normal first pregnancy, third trimester: Secondary | ICD-10-CM

## 2019-11-11 DIAGNOSIS — Z3A33 33 weeks gestation of pregnancy: Secondary | ICD-10-CM

## 2019-11-11 DIAGNOSIS — O26893 Other specified pregnancy related conditions, third trimester: Secondary | ICD-10-CM

## 2019-11-11 DIAGNOSIS — R102 Pelvic and perineal pain: Secondary | ICD-10-CM

## 2019-11-11 MED ORDER — CYCLOBENZAPRINE HCL 10 MG PO TABS: 10.0000 mg | ORAL_TABLET | Freq: Three times a day (TID) | ORAL | 2 refills | Status: DC | PRN

## 2019-11-11 MED ORDER — ACETAMINOPHEN 325 MG PO TABS: 650.0000 mg | ORAL_TABLET | ORAL | 3 refills | Status: AC | PRN

## 2019-11-11 NOTE — Progress Notes (Signed)
   PRENATAL VISIT NOTE  Subjective:  Amy Graham is a 19 y.o. G1P0 at [redacted]w[redacted]d being seen today for ongoing prenatal care.  She is currently monitored for the following issues for this low-risk pregnancy and has Encounter for supervision of normal first pregnancy in third trimester on their problem list.  Patient reports recurrent pelvic pain. Pain with walking and standing. Also feeling "sharp" midabdominal pain with fetal movement. Loves to relax with long showers but has recently had trouble inhaling humid air during longer showers and is concerned.  Contractions: Irritability. Vag. Bleeding: None.  Movement: Present. Denies leaking of fluid.   The following portions of the patient's history were reviewed and updated as appropriate: allergies, current medications, past family history, past medical history, past social history, past surgical history and problem list. Problem list updated.  Objective:   Vitals:   11/11/19 0952  BP: 109/74  Pulse: 87  Weight: 154 lb (69.9 kg)    Fetal Status: Fetal Heart Rate (bpm): 145 Fundal Height: 33 cm Movement: Present     General:  Alert, oriented and cooperative. Patient is in no acute distress.  Skin: Skin is warm and dry. No rash noted.   Cardiovascular: Normal heart rate noted  Respiratory: Normal respiratory effort, no problems with respiration noted  Abdomen: Soft, gravid, appropriate for gestational age.  Pain/Pressure: Present     Pelvic: Cervical exam deferred Dilation: Closed Effacement (%): Thick Station: Ballotable  Extremities: Normal range of motion.  Edema: None  Mental Status: Normal mood and affect. Normal behavior. Normal judgment and thought content.   Assessment and Plan:  Pregnancy: G1P0 at [redacted]w[redacted]d  1. Encounter for supervision of normal first pregnancy in third trimester - Routine care - Avoid prolonged hot showers, concern for overheating, lightheadness  2. [redacted] weeks gestation of pregnancy   3. Pelvic pain affecting  pregnancy in third trimester, antepartum - Continue maternity belt - Trial interventions as below - Ambulatory referral to Physical Therapy - cyclobenzaprine (FLEXERIL) 10 MG tablet; Take 1 tablet (10 mg total) by mouth 3 (three) times daily as needed for muscle spasms.  Dispense: 30 tablet; Refill: 2 - acetaminophen (TYLENOL) 325 MG tablet; Take 2 tablets (650 mg total) by mouth every 4 (four) hours as needed.  Dispense: 30 tablet; Refill: 3  Preterm labor symptoms and general obstetric precautions including but not limited to vaginal bleeding, contractions, leaking of fluid and fetal movement were reviewed in detail with the patient. Please refer to After Visit Summary for other counseling recommendations.  No follow-ups on file.  Future Appointments  Date Time Provider Department Center  11/26/2019 11:00 AM [place] Bing, MD [facility]  12/08/2019 10:30 AM Anyanwu, Jethro Bastos, MD [facility]    Calvert Cantor, CNM

## 2019-11-11 NOTE — Patient Instructions (Signed)
Fetal Movement Counts Patient Name: ________________________________________________ Patient Due Date: ____________________ What is a fetal movement count?  A fetal movement count is the number of times that you feel your baby move during a certain amount of time. This may also be called a fetal kick count. A fetal movement count is recommended for every pregnant woman. You may be asked to start counting fetal movements as early as week 28 of your pregnancy. Pay attention to when your baby is most active. You may notice your baby's sleep and wake cycles. You may also notice things that make your baby move more. You should do a fetal movement count:  When your baby is normally most active.  At the same time each day. A good time to count movements is while you are resting, after having something to eat and drink. How do I count fetal movements? 1. Find a quiet, comfortable area. Sit, or lie down on your side. 2. Write down the date, the start time and stop time, and the number of movements that you felt between those two times. Take this information with you to your health care visits. 3. Write down your start time when you feel the first movement. 4. Count kicks, flutters, swishes, rolls, and jabs. You should feel at least 10 movements. 5. You may stop counting after you have felt 10 movements, or if you have been counting for 2 hours. Write down the stop time. 6. If you do not feel 10 movements in 2 hours, contact your health care provider for further instructions. Your health care provider may want to do additional tests to assess your baby's well-being. Contact a health care provider if:  You feel fewer than 10 movements in 2 hours.  Your baby is not moving like he or she usually does. Date: ____________ Start time: ____________ Stop time: ____________ Movements: ____________ Date: ____________ Start time: ____________ Stop time: ____________ Movements: ____________ Date: ____________  Start time: ____________ Stop time: ____________ Movements: ____________ Date: ____________ Start time: ____________ Stop time: ____________ Movements: ____________ Date: ____________ Start time: ____________ Stop time: ____________ Movements: ____________ Date: ____________ Start time: ____________ Stop time: ____________ Movements: ____________ Date: ____________ Start time: ____________ Stop time: ____________ Movements: ____________ Date: ____________ Start time: ____________ Stop time: ____________ Movements: ____________ Date: ____________ Start time: ____________ Stop time: ____________ Movements: ____________ This information is not intended to replace advice given to you by your health care provider. Make sure you discuss any questions you have with your health care provider. Document Revised: 08/21/2018 Document Reviewed: 08/21/2018 Elsevier Patient Education  2020 Elsevier Inc.  

## 2019-11-17 ENCOUNTER — Telehealth: Payer: Self-pay | Admitting: Radiology

## 2019-11-17 NOTE — Telephone Encounter (Signed)
Patient called stating that she did not need the referral for PT that the belt that was provided for her is helping with her issue

## 2019-11-26 ENCOUNTER — Other Ambulatory Visit: Payer: Self-pay

## 2019-11-26 ENCOUNTER — Ambulatory Visit (INDEPENDENT_AMBULATORY_CARE_PROVIDER_SITE_OTHER): Payer: Medicaid Other | Admitting: Obstetrics and Gynecology

## 2019-11-26 VITALS — BP 110/74 | HR 85 | Wt 157.0 lb

## 2019-11-26 DIAGNOSIS — Z3A35 35 weeks gestation of pregnancy: Secondary | ICD-10-CM

## 2019-11-26 NOTE — Progress Notes (Signed)
Prenatal Visit Note Date: 11/26/2019 Clinic: [facility]  Subjective:  Amy Graham is a 19 y.o. G1P0 at [redacted]w[redacted]d being seen today for ongoing prenatal care.  She is currently monitored for the following issues for this low-risk pregnancy and has Encounter for supervision of normal first pregnancy in third trimester on their problem list.  Patient reports occasional contractions.   Contractions: Irregular. Vag. Bleeding: None.  Movement: Present. Denies leaking of fluid.   The following portions of the patient's history were reviewed and updated as appropriate: allergies, current medications, past family history, past medical history, past social history, past surgical history and problem list. Problem list updated.  Objective:   Vitals:   11/26/19 1102  BP: 110/74  Pulse: 85  Weight: 157 lb (71.2 kg)    Fetal Status: Fetal Heart Rate (bpm): 147 Fundal Height: 35 cm Movement: Present  Presentation: Vertex  General:  Alert, oriented and cooperative. Patient is in no acute distress.  Skin: Skin is warm and dry. No rash noted.   Cardiovascular: Normal heart rate noted  Respiratory: Normal respiratory effort, no problems with respiration noted  Abdomen: Soft, gravid, appropriate for gestational age. Pain/Pressure: Present     Pelvic:  Cervical exam deferred        Extremities: Normal range of motion.  Edema: None  Mental Status: Normal mood and affect. Normal behavior. Normal judgment and thought content.   Urinalysis:      Assessment and Plan:  Pregnancy: G1P0 at [redacted]w[redacted]d  Routine care. GBS nv. Pt asking about 39wks elective IOL. D/w her re: this and not necessarily recommended. I told her I recommend cx check nv and then can d/w her based on her exam at that time.   Preterm labor symptoms and general obstetric precautions including but not limited to vaginal bleeding, contractions, leaking of fluid and fetal movement were reviewed in detail with the  patient. Please refer to After Visit Summary for other counseling recommendations.  Return in about 1 week (around 12/03/2019) for 7-10d , in person, md or app.   [place] Bing, MD

## 2019-11-30 ENCOUNTER — Encounter (HOSPITAL_COMMUNITY): Payer: Self-pay | Admitting: Obstetrics and Gynecology

## 2019-11-30 ENCOUNTER — Inpatient Hospital Stay (HOSPITAL_COMMUNITY)
Admission: AD | Admit: 2019-11-30 | Discharge: 2019-11-30 | Disposition: A | Discharge status: Home / Self Care | Payer: Medicaid Other | Attending: Family Medicine | Admitting: Family Medicine

## 2019-11-30 ENCOUNTER — Other Ambulatory Visit: Payer: Self-pay

## 2019-11-30 DIAGNOSIS — O26893 Other specified pregnancy related conditions, third trimester: Secondary | ICD-10-CM | POA: Diagnosis not present

## 2019-11-30 DIAGNOSIS — O4703 False labor before 37 completed weeks of gestation, third trimester: Secondary | ICD-10-CM | POA: Diagnosis present

## 2019-11-30 DIAGNOSIS — R197 Diarrhea, unspecified: Secondary | ICD-10-CM | POA: Diagnosis not present

## 2019-11-30 DIAGNOSIS — O479 False labor, unspecified: Secondary | ICD-10-CM

## 2019-11-30 DIAGNOSIS — Z3A35 35 weeks gestation of pregnancy: Secondary | ICD-10-CM | POA: Insufficient documentation

## 2019-11-30 LAB — URINALYSIS, ROUTINE W REFLEX MICROSCOPIC
Bacteria, UA: NONE SEEN
Bilirubin Urine: NEGATIVE
Glucose, UA: NEGATIVE mg/dL
Hgb urine dipstick: NEGATIVE
Ketones, ur: NEGATIVE mg/dL
Nitrite: NEGATIVE
Protein, ur: NEGATIVE mg/dL
Specific Gravity, Urine: 1.014 (ref 1.005–1.030)
pH: 6 (ref 5.0–8.0)

## 2019-11-30 NOTE — Discharge Instructions (Signed)
Braxton Hicks Contractions Contractions of the uterus can occur throughout pregnancy, but they are not always a sign that you are in labor. You may have practice contractions called Braxton Hicks contractions. These false labor contractions are sometimes confused with true labor. What are Braxton Hicks contractions? Braxton Hicks contractions are tightening movements that occur in the muscles of the uterus before labor. Unlike true labor contractions, these contractions do not result in opening (dilation) and thinning of the cervix. Toward the end of pregnancy (32-34 weeks), Braxton Hicks contractions can happen more often and may become stronger. These contractions are sometimes difficult to tell apart from true labor because they can be very uncomfortable. You should not feel embarrassed if you go to the hospital with false labor. Sometimes, the only way to tell if you are in true labor is for your health care provider to look for changes in the cervix. The health care provider will do a physical exam and may monitor your contractions. If you are not in true labor, the exam should show that your cervix is not dilating and your water has not broken. If there are no other health problems associated with your pregnancy, it is completely safe for you to be sent home with false labor. You may continue to have Braxton Hicks contractions until you go into true labor. How to tell the difference between true labor and false labor True labor  Contractions last 30-70 seconds.  Contractions become very regular.  Discomfort is usually felt in the top of the uterus, and it spreads to the lower abdomen and low back.  Contractions do not go away with walking.  Contractions usually become more intense and increase in frequency.  The cervix dilates and gets thinner. False labor  Contractions are usually shorter and not as strong as true labor contractions.  Contractions are usually irregular.  Contractions  are often felt in the front of the lower abdomen and in the groin.  Contractions may go away when you walk around or change positions while lying down.  Contractions get weaker and are shorter-lasting as time goes on.  The cervix usually does not dilate or become thin. Follow these instructions at home:   Take over-the-counter and prescription medicines only as told by your health care provider.  Keep up with your usual exercises and follow other instructions from your health care provider.  Eat and drink lightly if you think you are going into labor.  If Braxton Hicks contractions are making you uncomfortable: ? Change your position from lying down or resting to walking, or change from walking to resting. ? Sit and rest in a tub of warm water. ? Drink enough fluid to keep your urine pale yellow. Dehydration may cause these contractions. ? Do slow and deep breathing several times an hour.  Keep all follow-up prenatal visits as told by your health care provider. This is important. Contact a health care provider if:  You have a fever.  You have continuous pain in your abdomen. Get help right away if:  Your contractions become stronger, more regular, and closer together.  You have fluid leaking or gushing from your vagina.  You pass blood-tinged mucus (bloody show).  You have bleeding from your vagina.  You have low back pain that you never had before.  You feel your baby's head pushing down and causing pelvic pressure.  Your baby is not moving inside you as much as it used to. Summary  Contractions that occur before labor are   called Braxton Hicks contractions, false labor, or practice contractions.  Braxton Hicks contractions are usually shorter, weaker, farther apart, and less regular than true labor contractions. True labor contractions usually become progressively stronger and regular, and they become more frequent.  Manage discomfort from Braxton Hicks contractions  by changing position, resting in a warm bath, drinking plenty of water, or practicing deep breathing. This information is not intended to replace advice given to you by your health care provider. Make sure you discuss any questions you have with your health care provider. Document Revised: 12/14/2016 Document Reviewed: 05/17/2016 Elsevier Patient Education  2020 Elsevier Inc. Fetal Movement Counts Patient Name: ________________________________________________ Patient Due Date: ____________________ What is a fetal movement count?  A fetal movement count is the number of times that you feel your baby move during a certain amount of time. This may also be called a fetal kick count. A fetal movement count is recommended for every pregnant woman. You may be asked to start counting fetal movements as early as week 28 of your pregnancy. Pay attention to when your baby is most active. You may notice your baby's sleep and wake cycles. You may also notice things that make your baby move more. You should do a fetal movement count:  When your baby is normally most active.  At the same time each day. A good time to count movements is while you are resting, after having something to eat and drink. How do I count fetal movements? 1. Find a quiet, comfortable area. Sit, or lie down on your side. 2. Write down the date, the start time and stop time, and the number of movements that you felt between those two times. Take this information with you to your health care visits. 3. Write down your start time when you feel the first movement. 4. Count kicks, flutters, swishes, rolls, and jabs. You should feel at least 10 movements. 5. You may stop counting after you have felt 10 movements, or if you have been counting for 2 hours. Write down the stop time. 6. If you do not feel 10 movements in 2 hours, contact your health care provider for further instructions. Your health care provider may want to do additional tests  to assess your baby's well-being. Contact a health care provider if:  You feel fewer than 10 movements in 2 hours.  Your baby is not moving like he or she usually does. Date: ____________ Start time: ____________ Stop time: ____________ Movements: ____________ Date: ____________ Start time: ____________ Stop time: ____________ Movements: ____________ Date: ____________ Start time: ____________ Stop time: ____________ Movements: ____________ Date: ____________ Start time: ____________ Stop time: ____________ Movements: ____________ Date: ____________ Start time: ____________ Stop time: ____________ Movements: ____________ Date: ____________ Start time: ____________ Stop time: ____________ Movements: ____________ Date: ____________ Start time: ____________ Stop time: ____________ Movements: ____________ Date: ____________ Start time: ____________ Stop time: ____________ Movements: ____________ Date: ____________ Start time: ____________ Stop time: ____________ Movements: ____________ This information is not intended to replace advice given to you by your health care provider. Make sure you discuss any questions you have with your health care provider. Document Revised: 08/21/2018 Document Reviewed: 08/21/2018 Elsevier Patient Education  2020 Elsevier Inc.  

## 2019-11-30 NOTE — MAU Note (Signed)
CTX started around midnight, now coming about 5-77min.  Also having some watery stools when CTX started.  Denies LOF or vaginal bleeding.  Reports feeling fetal movement, but a little less than usual.

## 2019-11-30 NOTE — MAU Provider Note (Signed)
History     CSN: [id number]  Arrival date and time: 11/30/19 0998   First Provider Initiated Contact with Patient 11/30/19 [phone]      No chief complaint on file.  HPI This is a 19 yo G1 at 63w5dpresents with contractions that started around midnight and has been increasing some. Contractions 5-10 minutes apart. Good fetal movement. No leaking fluid or bleeding. Is having some loose stools.  OB History    Gravida  1   Para      Term      Preterm      AB      Living        SAB      TAB      Ectopic      Multiple      Live Births              History reviewed. No pertinent past medical history.  History reviewed. No pertinent surgical history.  Family History  Problem Relation Age of Onset  . Diabetes Mother   . Healthy Father     Social History   Tobacco Use  . Smoking status: Never Smoker  . Smokeless tobacco: Never Used  Substance Use Topics  . Alcohol use: No  . Drug use: No    Allergies: No Known Allergies  Medications Prior to Admission  Medication Sig Dispense Refill Last Dose  . acetaminophen (TYLENOL) 325 MG tablet Take 2 tablets (650 mg total) by mouth every 4 (four) hours as needed. 30 tablet 3 Past Month at Unknown time  . aspirin EC 81 MG tablet Take 1 tablet (81 mg total) by mouth daily. Take after 12 weeks for prevention of preeclampsia later in pregnancy 300 tablet 2 11/30/2019 at Unknown time  . Blood Pressure KIT Check blood pressure 1-2 time per week 1 kit 0   . camphor-menthol (SARNA) lotion Apply 1 application topically as needed for itching. 222 mL 0   . clobetasol ointment (TEMOVATE) 05.05% Apply 1 application topically 2 (two) times daily. To hand 30 g 0   . cyclobenzaprine (FLEXERIL) 10 MG tablet Take 1 tablet (10 mg total) by mouth 3 (three) times daily as needed for muscle spasms. 30 tablet 2   . Prenatal Vit-Fe Fumarate-FA (PRENATAL MULTIVITAMIN) TABS tablet Take 1 tablet by mouth daily at 12 noon. 30 tablet 2   .  triamcinolone cream (KENALOG) 0.5 % Apply 1 application topically 2 (two) times daily. 454 g 2     Review of Systems Physical Exam   Blood pressure 129/75, pulse 93, temperature (!) 97.5 F (36.4 C), temperature source Oral, resp. rate 18, weight 70.8 kg, last menstrual period 03/25/2019, SpO2 100 %.  Physical Exam Vitals reviewed. Exam conducted with a chaperone present.  Constitutional:      Appearance: Normal appearance.  Abdominal:     General: Abdomen is flat.     Palpations: Abdomen is soft.  Skin:    Capillary Refill: Capillary refill takes less than 2 seconds.  Neurological:     Mental Status: She is alert.  Psychiatric:        Mood and Affect: Mood normal.        Behavior: Behavior normal.        Thought Content: Thought content normal.        Judgment: Judgment normal.    Dilation: 1 Effacement (%): Thick Station: -3 Presentation: Vertex Exam by:: JJoyice Faster DO  MAU Course  Procedures NST: baseline  140s, moderate variability, no decelerations. + accelerations.  MDM Cervix unchanged  Assessment and Plan     ICD-10-CM   1. False labor  O47.9    Discharge to home. Return precautions given.  Truett Mainland 11/30/2019, 9:37 AM

## 2019-11-30 NOTE — MAU Note (Signed)
Printed AVS given to patient. Discussed labor precautions and fetal kick counts.  Pt feeling good fetal movement.  No other questions at this time.

## 2019-12-08 ENCOUNTER — Other Ambulatory Visit (HOSPITAL_COMMUNITY)
Admission: RE | Admit: 2019-12-08 | Discharge: 2019-12-08 | Disposition: A | Discharge status: Home / Self Care | Payer: Medicaid Other | Source: Ambulatory Visit | Attending: Obstetrics & Gynecology | Admitting: Obstetrics & Gynecology

## 2019-12-08 ENCOUNTER — Ambulatory Visit (INDEPENDENT_AMBULATORY_CARE_PROVIDER_SITE_OTHER): Payer: Medicaid Other | Admitting: Obstetrics & Gynecology

## 2019-12-08 ENCOUNTER — Other Ambulatory Visit: Payer: Self-pay

## 2019-12-08 ENCOUNTER — Encounter: Payer: Self-pay | Admitting: Obstetrics & Gynecology

## 2019-12-08 VITALS — BP 108/74 | HR 99 | Wt 157.4 lb

## 2019-12-08 DIAGNOSIS — O368139 Decreased fetal movements, third trimester, other fetus: Secondary | ICD-10-CM

## 2019-12-08 DIAGNOSIS — Z3403 Encounter for supervision of normal first pregnancy, third trimester: Secondary | ICD-10-CM | POA: Diagnosis not present

## 2019-12-08 DIAGNOSIS — Z3A36 36 weeks gestation of pregnancy: Secondary | ICD-10-CM | POA: Diagnosis not present

## 2019-12-08 NOTE — Patient Instructions (Signed)
Return to office for any scheduled appointments. Call the office or go to the MAU at [facility] [place] if:  You begin to have strong, frequent contractions  Your water breaks.  Sometimes it is a big gush of fluid, sometimes it is just a trickle that keeps getting your panties wet or running down your legs  You have vaginal bleeding.  It is normal to have a small amount of spotting if your cervix was checked.   You do not feel your baby moving like normal.  If you do not, get something to eat and drink and lay down and focus on feeling your baby move.   If your baby is still not moving like normal, you should call the office or go to MAU.  Any other obstetric concerns.   

## 2019-12-08 NOTE — Progress Notes (Signed)
   PRENATAL VISIT NOTE  Subjective:  Amy Graham is a 19 y.o. G1P0 at [redacted]w[redacted]d being seen today for ongoing prenatal care.  She is currently monitored for the following issues for this low-risk pregnancy and has Encounter for supervision of normal first pregnancy in third trimester on their problem list.  Patient reports backache and decreased fetal movement (1-2 times per day).  Contractions: Irregular. Vag. Bleeding: None.  Movement: (!) Decreased. Denies leaking of fluid.   The following portions of the patient's history were reviewed and updated as appropriate: allergies, current medications, past family history, past medical history, past social history, past surgical history and problem list.   Objective:   Vitals:   12/08/19 1047  BP: 108/74  Pulse: 99  Weight: 157 lb 6.4 oz (71.4 kg)    Fetal Status: Fetal Heart Rate (bpm): 143 Fundal Height: 36 cm Movement: (!) Decreased  Presentation: Vertex  General:  Alert, oriented and cooperative. Patient is in no acute distress.  Skin: Skin is warm and dry. No rash noted.   Cardiovascular: Normal heart rate noted  Respiratory: Normal respiratory effort, no problems with respiration noted  Abdomen: Soft, gravid, appropriate for gestational age.  Pain/Pressure: Present     Pelvic: Cervical exam performed in the presence of a chaperone  Dilation: 1 Effacement (%): 50 Station: -2  Extremities: Normal range of motion.  Edema: None  Mental Status: Normal mood and affect. Normal behavior. Normal judgment and thought content.   Assessment and Plan:  Pregnancy: G1P0 at [redacted]w[redacted]d 1. Decreased fetal movement during pregnancy, antepartum, third trimester, other fetus -NST performed today was reviewed and was found to be reactive.  Continue fetal movement precautions.  2. [redacted] weeks gestation of pregnancy 3. Encounter for supervision of normal first pregnancy in third trimester Pelvic cultures done. - Strep Gp B NAA - GC/Chlamydia probe amp ([facility])not at [facility] Preterm labor symptoms and general obstetric precautions including but not limited to vaginal bleeding, contractions, leaking of fluid and fetal movement were reviewed in detail with the patient. Please refer to After Visit Summary for other counseling recommendations.   No follow-ups on file.  No future appointments.  Jaynie Collins, MD

## 2019-12-09 LAB — GC/CHLAMYDIA PROBE AMP (~~LOC~~) NOT AT ARMC
Chlamydia: NEGATIVE
Comment: NEGATIVE
Comment: NORMAL
Neisseria Gonorrhea: NEGATIVE

## 2019-12-10 LAB — STREP GP B NAA: Strep Gp B NAA: NEGATIVE

## 2019-12-15 ENCOUNTER — Ambulatory Visit (INDEPENDENT_AMBULATORY_CARE_PROVIDER_SITE_OTHER): Payer: Medicaid Other | Admitting: Obstetrics & Gynecology

## 2019-12-15 ENCOUNTER — Other Ambulatory Visit: Payer: Self-pay

## 2019-12-15 VITALS — BP 112/71 | HR 83 | Wt 162.6 lb

## 2019-12-15 DIAGNOSIS — Z3A37 37 weeks gestation of pregnancy: Secondary | ICD-10-CM

## 2019-12-15 DIAGNOSIS — Z3403 Encounter for supervision of normal first pregnancy, third trimester: Secondary | ICD-10-CM

## 2019-12-15 NOTE — Patient Instructions (Signed)
Return to office for any scheduled appointments. Call the office or go to the MAU at [facility] [place] if:  You begin to have strong, frequent contractions  Your water breaks.  Sometimes it is a big gush of fluid, sometimes it is just a trickle that keeps getting your panties wet or running down your legs  You have vaginal bleeding.  It is normal to have a small amount of spotting if your cervix was checked.   You do not feel your baby moving like normal.  If you do not, get something to eat and drink and lay down and focus on feeling your baby move.   If your baby is still not moving like normal, you should call the office or go to MAU.  Any other obstetric concerns.   

## 2019-12-15 NOTE — Progress Notes (Signed)
   PRENATAL VISIT NOTE  Subjective:  Amy Graham is a 19 y.o. G1P0 at [redacted]w[redacted]d being seen today for ongoing prenatal care. Accompanied by mother.  She is currently monitored for the following issues for this low-risk pregnancy and has Encounter for supervision of normal first pregnancy in third trimester on their problem list.  Patient reports no complaints.  Contractions: Irregular. Vag. Bleeding: None.  Movement: Present. Denies leaking of fluid.   The following portions of the patient's history were reviewed and updated as appropriate: allergies, current medications, past family history, past medical history, past social history, past surgical history and problem list.   Objective:   Vitals:   12/15/19 1310  BP: 112/71  Pulse: 83  Weight: 162 lb 9.6 oz (73.8 kg)    Fetal Status: Fetal Heart Rate (bpm): 143 Fundal Height: 38 cm Movement: Present     General:  Alert, oriented and cooperative. Patient is in no acute distress.  Skin: Skin is warm and dry. No rash noted.   Cardiovascular: Normal heart rate noted  Respiratory: Normal respiratory effort, no problems with respiration noted  Abdomen: Soft, gravid, appropriate for gestational age.  Pain/Pressure: Present     Pelvic: Cervical exam deferred        Extremities: Normal range of motion.  Edema: None  Mental Status: Normal mood and affect. Normal behavior. Normal judgment and thought content.   Results for orders placed or performed in visit on 12/08/19 (from the past 168 hour(s))  Strep Gp B NAA   Collection Time: 12/08/19  3:00 PM   Specimen: Vaginal/Rectal; Genital   VR  Result Value Ref Range   Strep Gp B NAA Negative Negative    Assessment and Plan:  Pregnancy: G1P0 at [redacted]w[redacted]d 1. [redacted] weeks gestation of pregnancy 2. Encounter for supervision of normal first pregnancy in third trimester Negative pelvic cultures, discussed with patient. No other concerns. Term labor symptoms and general obstetric precautions including  but not limited to vaginal bleeding, contractions, leaking of fluid and fetal movement were reviewed in detail with the patient. Please refer to After Visit Summary for other counseling recommendations.   Return in about 1 week (around 12/22/2019) for OFFICE OB VISIT (MD or APP).  No future appointments.  Jaynie Collins, MD

## 2019-12-16 ENCOUNTER — Encounter (HOSPITAL_COMMUNITY): Payer: Self-pay | Admitting: Obstetrics and Gynecology

## 2019-12-16 ENCOUNTER — Inpatient Hospital Stay (EMERGENCY_DEPARTMENT_HOSPITAL)
Admission: AD | Admit: 2019-12-16 | Discharge: 2019-12-16 | Disposition: A | Discharge status: Home / Self Care | Payer: Medicaid Other | Source: Home / Self Care | Attending: Obstetrics & Gynecology | Admitting: Obstetrics & Gynecology

## 2019-12-16 ENCOUNTER — Inpatient Hospital Stay (HOSPITAL_COMMUNITY)
Admission: AD | Admit: 2019-12-16 | Discharge: 2019-12-18 | DRG: 807 | Disposition: A | Discharge status: Home / Self Care | Payer: Medicaid Other | Attending: Obstetrics & Gynecology | Admitting: Obstetrics & Gynecology

## 2019-12-16 ENCOUNTER — Encounter (HOSPITAL_COMMUNITY): Payer: Self-pay | Admitting: Obstetrics & Gynecology

## 2019-12-16 ENCOUNTER — Other Ambulatory Visit: Payer: Self-pay

## 2019-12-16 DIAGNOSIS — Z3A38 38 weeks gestation of pregnancy: Secondary | ICD-10-CM | POA: Insufficient documentation

## 2019-12-16 DIAGNOSIS — O471 False labor at or after 37 completed weeks of gestation: Secondary | ICD-10-CM | POA: Insufficient documentation

## 2019-12-16 DIAGNOSIS — Z20822 Contact with and (suspected) exposure to covid-19: Secondary | ICD-10-CM | POA: Diagnosis present

## 2019-12-16 DIAGNOSIS — O4202 Full-term premature rupture of membranes, onset of labor within 24 hours of rupture: Secondary | ICD-10-CM | POA: Diagnosis not present

## 2019-12-16 DIAGNOSIS — O26893 Other specified pregnancy related conditions, third trimester: Secondary | ICD-10-CM | POA: Diagnosis present

## 2019-12-16 DIAGNOSIS — Z8759 Personal history of other complications of pregnancy, childbirth and the puerperium: Secondary | ICD-10-CM | POA: Diagnosis not present

## 2019-12-16 DIAGNOSIS — O479 False labor, unspecified: Secondary | ICD-10-CM

## 2019-12-16 LAB — RESP PANEL BY RT-PCR (FLU A&B, COVID) ARPGX2
Influenza A by PCR: NEGATIVE
Influenza B by PCR: NEGATIVE
SARS Coronavirus 2 by RT PCR: NEGATIVE

## 2019-12-16 LAB — CBC
HCT: 34.9 % — ABNORMAL LOW (ref 36.0–46.0)
Hemoglobin: 11.6 g/dL — ABNORMAL LOW (ref 12.0–15.0)
MCH: 28.9 pg (ref 26.0–34.0)
MCHC: 33.2 g/dL (ref 30.0–36.0)
MCV: 87 fL (ref 80.0–100.0)
Platelets: 203 10*3/uL (ref 150–400)
RBC: 4.01 MIL/uL (ref 3.87–5.11)
RDW: 13.1 % (ref 11.5–15.5)
WBC: 6.5 10*3/uL (ref 4.0–10.5)
nRBC: 0 % (ref 0.0–0.2)

## 2019-12-16 LAB — TYPE AND SCREEN
ABO/RH(D): A POS
Antibody Screen: NEGATIVE

## 2019-12-16 MED ORDER — COCONUT OIL OIL: 1.0000 "application " | TOPICAL_OIL | Status: DC | PRN

## 2019-12-16 MED ORDER — IBUPROFEN 600 MG PO TABS
600.0000 mg | ORAL_TABLET | Freq: Four times a day (QID) | ORAL | Status: DC
  Administered 2019-12-16 – 2019-12-18 (×7): 600 mg via ORAL
  Filled 2019-12-16 (×7): qty 1

## 2019-12-16 MED ORDER — OXYTOCIN BOLUS FROM INFUSION
333.0000 mL | Freq: Once | INTRAVENOUS | Status: AC
  Administered 2019-12-16: 333 mL via INTRAVENOUS

## 2019-12-16 MED ORDER — BENZOCAINE-MENTHOL 20-0.5 % EX AERO
1.0000 "application " | INHALATION_SPRAY | CUTANEOUS | Status: DC | PRN
  Administered 2019-12-16: 1 via TOPICAL
  Filled 2019-12-16: qty 56

## 2019-12-16 MED ORDER — ONDANSETRON HCL 4 MG/2ML IJ SOLN: 4.0000 mg | INTRAMUSCULAR | Status: DC | PRN

## 2019-12-16 MED ORDER — WITCH HAZEL-GLYCERIN EX PADS: 1.0000 "application " | MEDICATED_PAD | CUTANEOUS | Status: DC | PRN

## 2019-12-16 MED ORDER — OXYTOCIN-SODIUM CHLORIDE 30-0.9 UT/500ML-% IV SOLN
2.5000 [IU]/h | INTRAVENOUS | Status: DC
  Administered 2019-12-16: 2.5 [IU]/h via INTRAVENOUS
  Filled 2019-12-16: qty 500

## 2019-12-16 MED ORDER — LACTATED RINGERS IV SOLN: INTRAVENOUS | Status: DC

## 2019-12-16 MED ORDER — SENNOSIDES-DOCUSATE SODIUM 8.6-50 MG PO TABS
2.0000 | ORAL_TABLET | ORAL | Status: DC
  Administered 2019-12-16 – 2019-12-17 (×2): 2 via ORAL
  Filled 2019-12-16 (×2): qty 2

## 2019-12-16 MED ORDER — ACETAMINOPHEN 325 MG PO TABS: 650.0000 mg | ORAL_TABLET | ORAL | Status: DC | PRN

## 2019-12-16 MED ORDER — ZOLPIDEM TARTRATE 5 MG PO TABS: 5.0000 mg | ORAL_TABLET | Freq: Every evening | ORAL | Status: DC | PRN

## 2019-12-16 MED ORDER — SOD CITRATE-CITRIC ACID 500-334 MG/5ML PO SOLN: 30.0000 mL | ORAL | Status: DC | PRN

## 2019-12-16 MED ORDER — OXYCODONE-ACETAMINOPHEN 5-325 MG PO TABS: 1.0000 | ORAL_TABLET | ORAL | Status: DC | PRN

## 2019-12-16 MED ORDER — SIMETHICONE 80 MG PO CHEW: 80.0000 mg | CHEWABLE_TABLET | ORAL | Status: DC | PRN

## 2019-12-16 MED ORDER — LACTATED RINGERS IV SOLN: 500.0000 mL | INTRAVENOUS | Status: DC | PRN

## 2019-12-16 MED ORDER — TETANUS-DIPHTH-ACELL PERTUSSIS 5-2.5-18.5 LF-MCG/0.5 IM SUSY: 0.5000 mL | PREFILLED_SYRINGE | Freq: Once | INTRAMUSCULAR | Status: DC

## 2019-12-16 MED ORDER — MEASLES, MUMPS & RUBELLA VAC IJ SOLR: 0.5000 mL | Freq: Once | INTRAMUSCULAR | Status: DC

## 2019-12-16 MED ORDER — DIBUCAINE (PERIANAL) 1 % EX OINT: 1.0000 "application " | TOPICAL_OINTMENT | CUTANEOUS | Status: DC | PRN

## 2019-12-16 MED ORDER — LIDOCAINE HCL (PF) 1 % IJ SOLN
30.0000 mL | INTRAMUSCULAR | Status: AC | PRN
  Administered 2019-12-16: 30 mL via SUBCUTANEOUS
  Filled 2019-12-16: qty 30

## 2019-12-16 MED ORDER — ONDANSETRON HCL 4 MG PO TABS: 4.0000 mg | ORAL_TABLET | ORAL | Status: DC | PRN

## 2019-12-16 MED ORDER — FENTANYL CITRATE (PF) 100 MCG/2ML IJ SOLN
50.0000 ug | INTRAMUSCULAR | Status: DC | PRN
  Administered 2019-12-16: 100 ug via INTRAVENOUS
  Filled 2019-12-16 (×2): qty 2

## 2019-12-16 MED ORDER — AMMONIA AROMATIC IN INHA
RESPIRATORY_TRACT | Status: AC
  Filled 2019-12-16: qty 10

## 2019-12-16 MED ORDER — DIPHENHYDRAMINE HCL 25 MG PO CAPS: 25.0000 mg | ORAL_CAPSULE | Freq: Four times a day (QID) | ORAL | Status: DC | PRN

## 2019-12-16 MED ORDER — OXYCODONE-ACETAMINOPHEN 5-325 MG PO TABS: 2.0000 | ORAL_TABLET | ORAL | Status: DC | PRN

## 2019-12-16 MED ORDER — OXYCODONE HCL 5 MG PO TABS: 5.0000 mg | ORAL_TABLET | ORAL | Status: DC | PRN

## 2019-12-16 MED ORDER — LEVONORGESTREL 19.5 MCG/DAY IU IUD
INTRAUTERINE_SYSTEM | Freq: Once | INTRAUTERINE | Status: DC
  Filled 2019-12-16: qty 1

## 2019-12-16 MED ORDER — PRENATAL MULTIVITAMIN CH
1.0000 | ORAL_TABLET | Freq: Every day | ORAL | Status: DC
  Administered 2019-12-17 – 2019-12-18 (×2): 1 via ORAL
  Filled 2019-12-16 (×2): qty 1

## 2019-12-16 MED ORDER — ONDANSETRON HCL 4 MG/2ML IJ SOLN: 4.0000 mg | Freq: Four times a day (QID) | INTRAMUSCULAR | Status: DC | PRN

## 2019-12-16 NOTE — Discharge Instructions (Signed)
Labor and Vaginal Delivery  Vaginal delivery means that you give birth by pushing your baby out of your birth canal (vagina). A team of health care providers will help you before, during, and after vaginal delivery. Birth experiences are unique for every woman and every pregnancy, and birth experiences vary depending on where you choose to give birth. What happens when I arrive at the birth center or hospital? Once you are in labor and have been admitted into the hospital or birth center, your health care provider may:  Review your pregnancy history and any concerns that you have.  Insert an IV into one of your veins. This may be used to give you fluids and medicines.  Check your blood pressure, pulse, temperature, and heart rate (vital signs).  Check whether your bag of water (amniotic sac) has broken (ruptured).  Talk with you about your birth plan and discuss pain control options. Monitoring Your health care provider may monitor your contractions (uterine monitoring) and your baby's heart rate (fetal monitoring). You may need to be monitored:  Often, but not continuously (intermittently).  All the time or for long periods at a time (continuously). Continuous monitoring may be needed if: ? You are taking certain medicines, such as medicine to relieve pain or make your contractions stronger. ? You have pregnancy or labor complications. Monitoring may be done by:  Placing a special stethoscope or a handheld monitoring device on your abdomen to check your baby's heartbeat and to check for contractions.  Placing monitors on your abdomen (external monitors) to record your baby's heartbeat and the frequency and length of contractions.  Placing monitors inside your uterus through your vagina (internal monitors) to record your baby's heartbeat and the frequency, length, and strength of your contractions. Depending on the type of monitor, it may remain in your uterus or on your baby's head  until birth.  Telemetry. This is a type of continuous monitoring that can be done with external or internal monitors. Instead of having to stay in bed, you are able to move around during telemetry. Physical exam Your health care provider may perform frequent physical exams. This may include:  Checking how and where your baby is positioned in your uterus.  Checking your cervix to determine: ? Whether it is thinning out (effacing). ? Whether it is opening up (dilating). What happens during labor and delivery?  Normal labor and delivery is divided into the following three stages: Stage 1  This is the longest stage of labor.  This stage can last for hours or days.  Throughout this stage, you will feel contractions. Contractions generally feel mild, infrequent, and irregular at first. They get stronger, more frequent (about every 2-3 minutes), and more regular as you move through this stage.  This stage ends when your cervix is completely dilated to 4 inches (10 cm) and completely effaced. Stage 2  This stage starts once your cervix is completely effaced and dilated and lasts until the delivery of your baby.  This stage may last from 20 minutes to 2 hours.  This is the stage where you will feel an urge to push your baby out of your vagina.  You may feel stretching and burning pain, especially when the widest part of your baby's head passes through the vaginal opening (crowning).  Once your baby is delivered, the umbilical cord will be clamped and cut. This usually occurs after waiting a period of 1-2 minutes after delivery.  Your baby will be placed on your   bare chest (skin-to-skin contact) in an upright position and covered with a warm blanket. Watch your baby for feeding cues, like rooting or sucking, and help the baby to your breast for his or her first feeding. Stage 3  This stage starts immediately after the birth of your baby and ends after you deliver the placenta.  This  stage may take anywhere from 5 to 30 minutes.  After your baby has been delivered, you will feel contractions as your body expels the placenta and your uterus contracts to control bleeding. What can I expect after labor and delivery?  After labor is over, you and your baby will be monitored closely until you are ready to go home to ensure that you are both healthy. Your health care team will teach you how to care for yourself and your baby.  You and your baby will stay in the same room (rooming in) during your hospital stay. This will encourage early bonding and successful breastfeeding.  You may continue to receive fluids and medicines through an IV.  Your uterus will be checked and massaged regularly (fundal massage).  You will have some soreness and pain in your abdomen, vagina, and the area of skin between your vaginal opening and your anus (perineum).  If an incision was made near your vagina (episiotomy) or if you had some vaginal tearing during delivery, cold compresses may be placed on your episiotomy or your tear. This helps to reduce pain and swelling.  You may be given a squirt bottle to use instead of wiping when you go to the bathroom. To use the squirt bottle, follow these steps: ? Before you urinate, fill the squirt bottle with warm water. Do not use hot water. ? After you urinate, while you are sitting on the toilet, use the squirt bottle to rinse the area around your urethra and vaginal opening. This rinses away any urine and blood. ? Fill the squirt bottle with clean water every time you use the bathroom.  It is normal to have vaginal bleeding after delivery. Wear a sanitary pad for vaginal bleeding and discharge. Summary  Vaginal delivery means that you will give birth by pushing your baby out of your birth canal (vagina).  Your health care provider may monitor your contractions (uterine monitoring) and your baby's heart rate (fetal monitoring).  Your health care  provider may perform a physical exam.  Normal labor and delivery is divided into three stages.  After labor is over, you and your baby will be monitored closely until you are ready to go home. This information is not intended to replace advice given to you by your health care provider. Make sure you discuss any questions you have with your health care provider. Document Revised: 02/05/2017 Document Reviewed: 02/05/2017 Elsevier Patient Education  2020 Elsevier Inc.  

## 2019-12-16 NOTE — MAU Note (Signed)
I have communicated with Wynelle Bourgeois, CNM and reviewed vital signs:  Vitals:   12/16/19 0136 12/16/19 0341  BP: 130/82 121/74  Pulse: 79 75  Resp:  17  Temp:    SpO2: 98% 100%    Vaginal exam:  Dilation: 3.5 Effacement (%): 80 Cervical Position: Anterior Station: -2 Presentation: Vertex Exam by:: Erle Crocker, RN,   Also reviewed contraction pattern and that non-stress test is reactive.  It has been documented that patient is contracting every 1.5-5 minutes with no cervical change over 2 hours and 10 minutes not indicating active labor.  Patient denies any other complaints.  Based on this report provider has given order for discharge.  A discharge order and diagnosis entered by a provider.   Labor discharge instructions reviewed with patient. [employer] given.

## 2019-12-16 NOTE — Discharge Summary (Signed)
Postpartum Discharge Summary    Patient Name: Amy Graham DOB: 01-17-2000 MRN: [id number]  Date of admission: 12/16/2019 Delivery date:12/16/2019  Delivering provider: Serita Grammes D  Date of discharge: 12/18/2019  Admitting diagnosis: Normal labor [O80, Z37.9] Intrauterine pregnancy: [redacted]w[redacted]d    Secondary diagnosis:  Active Problems:   Normal labor  Additional problems: GBS neg    Discharge diagnosis: Term Pregnancy Delivered                                              Post partum procedures:none Augmentation: none Complications: None  Hospital course: Onset of Labor With Vaginal Delivery      19y.o. yo G1P1001 at [id number] admitted in Latent Labor on 12/16/2019. Patient had an uncomplicated labor course. She had initially wanted a postplacental IUD placement, but declined due to the potential discomfort as she did not have an epidural in place.  Membrane Rupture Time/Date: 7:55 AM ,12/16/2019   Delivery Method:Vaginal, Spontaneous  Episiotomy: None  Lacerations:  1st degree;Perineal  Patient had an uncomplicated postpartum course.  She is ambulating, tolerating a regular diet, passing flatus, and urinating well. Patient is discharged home in stable condition on 12/18/19.  Newborn Data: Birth date:12/16/2019  Birth time:7:50 PM  Gender:Female  Living status:Living  Apgars:9 ,9  Weight:7 lb 9.2 oz (3.436 kg) (7lb 9.2oz)  Magnesium Sulfate received: No BMZ received: No Rhophylac:N/A MMR:N/A T-DaP:Given prenatally Flu: Yes Transfusion:No  Physical exam  Vitals:   12/17/19 0210 12/17/19 0541 12/17/19 2035 12/18/19 0611  BP: 121/85 114/79 112/73 113/76  Pulse: 90 84 86 85  Resp: '16 16 15 16  ' Temp: 99.1 F (37.3 C) 98.4 F (36.9 C) 98.3 F (36.8 C) 98.2 F (36.8 C)  TempSrc: Oral Oral Oral Oral  SpO2: 99% 99% 100% 100%  Weight:      Height:       General: alert, cooperative and no distress Lochia: appropriate Uterine Fundus: firm Incision: Healing well with no  significant drainage DVT Evaluation: No evidence of DVT seen on physical exam. Labs: Lab Results  Component Value Date   WBC 6.5 12/16/2019   HGB 11.6 (L) 12/16/2019   HCT 34.9 (L) 12/16/2019   MCV 87.0 12/16/2019   PLT 203 12/16/2019   CMP Latest Ref Rng & Units 06/10/2019  Glucose 65 - 99 mg/dL 71  BUN 6 - 20 mg/dL 9  Creatinine 0.57 - 1.00 mg/dL 0.67  Sodium 134 - 144 mmol/L 134  Potassium 3.5 - 5.2 mmol/L 4.3  Chloride 96 - 106 mmol/L 100  CO2 20 - 29 mmol/L 19(L)  Calcium 8.7 - 10.2 mg/dL 9.7  Total Protein 6.0 - 8.5 g/dL 7.5  Total Bilirubin 0.0 - 1.2 mg/dL 0.2  Alkaline Phos 45 - 106 IU/L 61  AST 0 - 40 IU/L 14  ALT 0 - 32 IU/L 11   Edinburgh Score: Edinburgh Postnatal Depression Scale Screening Tool 12/17/2019  I have been able to laugh and see the funny side of things. 0  I have looked forward with enjoyment to things. 0  I have blamed myself unnecessarily when things went wrong. 2  I have been anxious or worried for no good reason. 0  I have felt scared or panicky for no good reason. 0  Things have been getting on top of me. 0  I have been so unhappy that I have  had difficulty sleeping. 0  I have felt sad or miserable. 0  I have been so unhappy that I have been crying. 0  The thought of harming myself has occurred to me. 0  Edinburgh Postnatal Depression Scale Total 2     After visit meds:  Allergies as of 12/18/2019   No Known Allergies     Medication List    STOP taking these medications   aspirin EC 81 MG tablet   Blood Pressure Kit   cyclobenzaprine 10 MG tablet Commonly known as: FLEXERIL   Sarna lotion Generic drug: camphor-menthol   triamcinolone cream 0.5 % Commonly known as: KENALOG     TAKE these medications   acetaminophen 325 MG tablet Commonly known as: Tylenol Take 2 tablets (650 mg total) by mouth every 4 (four) hours as needed (for pain scale < 4).   ibuprofen 600 MG tablet Commonly known as: ADVIL Take 1 tablet (600 mg  total) by mouth every 6 (six) hours.   prenatal multivitamin Tabs tablet Take 1 tablet by mouth daily at 12 noon.        Discharge home in stable condition Infant Feeding: Bottle Infant Disposition:home with mother Discharge instruction: per After Visit Summary and Postpartum booklet. Activity: Advance as tolerated. Pelvic rest for 6 weeks.  Diet: routine diet Future Appointments: Future Appointments  Date Time Provider [place]  01/20/2020 10:30 AM Darlina Rumpf, CNM [facility]   Follow up Visit:  Myrtis Ser, [place] Please schedule this patient for Postpartum visit in: 4 weeks with the following provider: Any provider  In-Person or virtual (pt choice)  For C/S patients schedule nurse incision check in weeks 2 weeks: no  Low risk pregnancy complicated by: none  Delivery mode: SVD  Anticipated Birth Control: IUD  PP Procedures needed: IUD insertion  Schedule Integrated [place] visit: no   12/18/2019 Gabriel Carina, CNM

## 2019-12-16 NOTE — MAU Note (Signed)
Pt reports she started having leaking clear fluid around 755am. Was here last night and was 3cm and sent home. Continues to have ctx  q 3 min. Not feeling much movement.

## 2019-12-16 NOTE — Progress Notes (Addendum)
Amy Graham is a 19 y.o. G1P0 at [redacted]w[redacted]d by ultrasound admitted for active labor.  Subjective: Patient is doing well and progressing nicely. No augmentation at this time.   Objective: BP 129/82   Pulse 76   Temp 98.4 F (36.9 C)   Resp 18   Ht 5\' 5"  (1.651 m)   Wt 72.6 kg   LMP 03/25/2019 (LMP Unknown) Comment: took a pregnancy test on 4/13-POSITIVE   BMI 26.63 kg/m  No intake/output data recorded. No intake/output data recorded.  FHT:  FHR: 130 bpm, variability: moderate,  accelerations:  Present,  decelerations:  Absent UC:   regular, every 2-4 minutes SVE:   Dilation: 5.5 Effacement (%): 100 Station: -1 Exam by:: Mary [phone] Johnson, RN @ [phone]  Labs: Lab Results  Component Value Date   WBC 6.5 12/16/2019   HGB 11.6 (L) 12/16/2019   HCT 34.9 (L) 12/16/2019   MCV 87.0 12/16/2019   PLT 203 12/16/2019    Assessment / Plan: Spontaneous labor, progressing normally  Labor: Progressing normally Fetal Wellbeing:  Category I Pain Control:  Labor support without medications I/D:  GBS negative  Contraception: consented for Liletta IUD. Ordered.   Anticipated MOD:  NSVD  14/01/2019 PA-S 12/16/2019, 4:02 PM  I saw and evaluated the patient. I consented patient for Liletta IUD. I agree with the findings and the plan of care as documented in the PA student's note.  14/01/2019, MD [facility] Family Medicine Fellow, [facility] for [facility], [facility]

## 2019-12-16 NOTE — Progress Notes (Addendum)
Amy Graham is a 19 y.o. G1P0 at [redacted]w[redacted]d by ultrasound admitted for active labor  Subjective: Patient is doing well. She is fully dilated at this time.   Objective: BP 126/68   Pulse 74   Temp 98.3 F (36.8 C) (Oral)   Resp 18   Ht 5\' 5"  (1.651 m)   Wt 72.6 kg   LMP 03/25/2019 (LMP Unknown) Comment: took a pregnancy test on 4/13-POSITIVE   BMI 26.63 kg/m  No intake/output data recorded. No intake/output data recorded.  FHT:  FHR: 135 bpm, variability: moderate,  accelerations:  Present,  decelerations:  1 deceleration, recovered and back to baseline UC:   regular, every 2-3 minutes SVE:   Dilation: 10 Effacement (%): 100 Station: 0 Exam by:: R. Booker,RN  Labs: Lab Results  Component Value Date   WBC 6.5 12/16/2019   HGB 11.6 (L) 12/16/2019   HCT 34.9 (L) 12/16/2019   MCV 87.0 12/16/2019   PLT 203 12/16/2019    Assessment / Plan: Spontaneous labor, progressing normally  Labor: Progressing normally, anticipate SVD  Fetal Wellbeing:  Category I Pain Control:  Labor support without medications I/D:   GBS neg Anticipated MODE:  NSVD  14/01/2019 [place] PA-S 12/16/2019, 6:15 PM  I saw and evaluated the patient. I agree with the findings and the plan of care as documented in the PA student's note.  14/01/2019, MD [facility] Family Medicine Fellow, [facility] for [facility], [facility]

## 2019-12-16 NOTE — Discharge Instructions (Signed)

## 2019-12-16 NOTE — MAU Provider Note (Signed)
Chief Complaint:  Contractions   None    HPI: Amy Graham is a 19 y.o. G1P0 at 77w0dho presents to maternity admissions reporting painful contractions .  She reports good fetal movement, denies LOF, vaginal bleeding, vaginal itching/burning, urinary symptoms, h/a, dizziness, n/v, diarrhea, constipation or fever/chills.    Abdominal Pain This is a recurrent problem. The current episode started today. The problem occurs intermittently. The problem has been unchanged. The pain is located in the suprapubic region, LLQ and RLQ. The quality of the pain is cramping. The abdominal pain does not radiate. Pertinent negatives include no constipation, diarrhea, fever, headaches, myalgias, nausea or vomiting. Nothing aggravates the pain. The pain is relieved by nothing. She has tried nothing for the symptoms.    RN Note: AWanetta Funderburkeis a 19y.o. at 363w0dere in MAU reporting: CTX that began yesterday morning lasting throughout the day. She reports around 2000-2100 last night her CTX began getting closer together so she began timing them. She reports her CTX were around every 3 minutes apart and are now around every 1 minute. +FM. No VB or abnormal discharge.  Past Medical History: History reviewed. No pertinent past medical history.  Past obstetric history: OB History  Gravida Para Term Preterm AB Living  1            SAB TAB Ectopic Multiple Live Births               # Outcome Date GA Lbr Len/2nd Weight Sex Delivery Anes PTL Lv  1 Current             Past Surgical History: History reviewed. No pertinent surgical history.  Family History: Family History  Problem Relation Age of Onset  . Diabetes Mother   . Healthy Father     Social History: Social History   Tobacco Use  . Smoking status: Never Smoker  . Smokeless tobacco: Never Used  Substance Use Topics  . Alcohol use: No  . Drug use: No    Allergies: No Known Allergies  Meds:  Medications Prior to Admission  Medication  Sig Dispense Refill Last Dose  . aspirin EC 81 MG tablet Take 1 tablet (81 mg total) by mouth daily. Take after 12 weeks for prevention of preeclampsia later in pregnancy 300 tablet 2 12/16/2019 at Unknown time  . Prenatal Vit-Fe Fumarate-FA (PRENATAL MULTIVITAMIN) TABS tablet Take 1 tablet by mouth daily at 12 noon. 30 tablet 2 12/16/2019 at Unknown time  . Blood Pressure KIT Check blood pressure 1-2 time per week 1 kit 0   . camphor-menthol (SARNA) lotion Apply 1 application topically as needed for itching. (Patient not taking: Reported on 12/15/2019) 222 mL 0   . cyclobenzaprine (FLEXERIL) 10 MG tablet Take 1 tablet (10 mg total) by mouth 3 (three) times daily as needed for muscle spasms. (Patient not taking: Reported on 12/15/2019) 30 tablet 2   . triamcinolone cream (KENALOG) 0.5 % Apply 1 application topically 2 (two) times daily. (Patient not taking: Reported on 12/15/2019) 454 g 2     I have reviewed patient's Past Medical Hx, Surgical Hx, Family Hx, Social Hx, medications and allergies.   ROS:  Review of Systems  Constitutional: Negative for fever.  Gastrointestinal: Positive for abdominal pain. Negative for constipation, diarrhea, nausea and vomiting.  Musculoskeletal: Negative for myalgias.  Neurological: Negative for headaches.   Other systems negative  Physical Exam   Patient Vitals for the past 24 hrs:  BP Temp Temp src Pulse Resp  SpO2 Height Weight  12/16/19 0136 130/82 -- -- 79 -- 98 % -- --  12/16/19 0115 120/83 (!) 97.5 F (36.4 C) Oral 70 18 99 % '5\' 5"'  (1.651 m) 74.2 kg   Constitutional: Well-developed, well-nourished female in no acute distress.  Cardiovascular: normal rate and rhythm Respiratory: normal effort, clear to auscultation bilaterally GI: Abd soft, non-tender, gravid appropriate for gestational age.   No rebound or guarding. MS: Extremities nontender, no edema, normal ROM Neurologic: Alert and oriented x 4.  GU: Neg CVAT.  PELVIC EXAM:  Dilation:  3.5 Effacement (%): 80 Cervical Position: Anterior Station: -2 Presentation: Vertex Exam by:: Cloretta Ned, RN  FHT:  Baseline 140 , moderate variability, accelerations present, no decelerations Contractions: q 2-4 mins Irregular    Labs: No results found for this or any previous visit (from the past 24 hour(s)). A/Positive/-- (05/26 1047)  Imaging:  No results found.  MAU Course/MDM: NST reviewed and is reactive We gave her a two hour labor evaluation with no change in cervix   Treatments in MAU included EFM.    Assessment: SIngle IUP at 16w0dUterine contractions False labor  Plan: Discharge home Labor precautions and fetal kick counts Follow up in Office for prenatal visits and recheck cervix  Encouraged to return if she develops worsening of symptoms, increase in pain, fever, or other concerning symptoms.   Pt stable at time of discharge.  MHansel FeinsteinCNM, MSN Certified Nurse-Midwife 12/16/2019 3:40 AM

## 2019-12-16 NOTE — MAU Note (Signed)
...  Amy Graham is a 19 y.o. at [redacted]w[redacted]d here in MAU reporting: CTX that began yesterday morning lasting throughout the day. She reports around 2000-2100 last night her CTX began getting closer together so she began timing them. She reports her CTX were around every 3 minutes apart and are now around every 1 minute. +FM. No VB or abnormal discharge.

## 2019-12-16 NOTE — H&P (Addendum)
OBSTETRIC ADMISSION HISTORY AND PHYSICAL  Amy Graham is a 19 y.o. female G1P0 with IUP at 57w0dby ultrasound presenting for SROM. She reports +FMs, No LOF, no VB, no blurry vision, headaches or peripheral edema, and RUQ pain.  She plans on both breast and bottle feeding. She request postpartum IUD for birth control. She received her prenatal care at [facility]  Dating: By ultrasound --->  Estimated Date of Delivery: 12/30/19  Sono:    _0 , CWD, normal anatomy, cephalic presentation, 2016W 58% EFW   Prenatal History/Complications:  -35w0destation of pregnancy   Past Medical History: No past medical history on file.  Past Surgical History: No past surgical history on file.  Obstetrical History: OB History    Gravida  1   Para      Term      Preterm      AB      Living        SAB      TAB      Ectopic      Multiple      Live Births              Social History Social History   Socioeconomic History  . Marital status: Single    Spouse name: Not on file  . Number of children: Not on file  . Years of education: Not on file  . Highest education level: Not on file  Occupational History  . Not on file  Tobacco Use  . Smoking status: Never Smoker  . Smokeless tobacco: Never Used  Substance and Sexual Activity  . Alcohol use: No  . Drug use: No  . Sexual activity: Yes    Birth control/protection: None  Other Topics Concern  . Not on file  Social History Narrative  . Not on file   Social Determinants of Health   Financial Resource Strain:   . Difficulty of Paying Living Expenses: Not on file  Food Insecurity:   . Worried About RuCharity fundraisern the Last Year: Not on file  . Ran Out of Food in the Last Year: Not on file  Transportation Needs:   . Lack of Transportation (Medical): Not on file  . Lack of Transportation (Non-Medical): Not on file  Physical Activity:   . Days of Exercise per Week: Not on file  . Minutes of Exercise per  Session: Not on file  Stress:   . Feeling of Stress : Not on file  Social Connections:   . Frequency of Communication with Friends and Family: Not on file  . Frequency of Social Gatherings with Friends and Family: Not on file  . Attends Religious Services: Not on file  . Active Member of Clubs or Organizations: Not on file  . Attends ClArchivisteetings: Not on file  . Marital Status: Not on file    Family History: Family History  Problem Relation Age of Onset  . Diabetes Mother   . Healthy Father     Allergies: No Known Allergies  Medications Prior to Admission  Medication Sig Dispense Refill Last Dose  . aspirin EC 81 MG tablet Take 1 tablet (81 mg total) by mouth daily. Take after 12 weeks for prevention of preeclampsia later in pregnancy 300 tablet 2   . Blood Pressure KIT Check blood pressure 1-2 time per week 1 kit 0   . camphor-menthol (SARNA) lotion Apply 1 application topically as needed for itching. (Patient not taking: Reported on  12/15/2019) 222 mL 0   . cyclobenzaprine (FLEXERIL) 10 MG tablet Take 1 tablet (10 mg total) by mouth 3 (three) times daily as needed for muscle spasms. (Patient not taking: Reported on 12/15/2019) 30 tablet 2   . Prenatal Vit-Fe Fumarate-FA (PRENATAL MULTIVITAMIN) TABS tablet Take 1 tablet by mouth daily at 12 noon. 30 tablet 2   . triamcinolone cream (KENALOG) 0.5 % Apply 1 application topically 2 (two) times daily. (Patient not taking: Reported on 12/15/2019) 454 g 2      Review of Systems   All systems reviewed and negative except as stated in HPI  Blood pressure 127/87, pulse 83, temperature 98.4 F (36.9 C), resp. rate 18, height _0  (1.651 m), weight 76.2 kg, last menstrual period 03/25/2019. General appearance: alert, cooperative and no distress Chest: normal respiratory effort  Abdomen: soft, non-tender; gravid Extremities: no LE edema noted bilaterally, no sign of DVT Presentation: cephalic Fetal  monitoringBaseline: 140 bpm, Variability: Good {> 6 bpm), Accelerations: Reactive and Decelerations: Absent Uterine activity: contractions q3-5 min  Dilation: 3.5 Effacement (%): 80 Station: -2 Exam by:: K.Wilson,RN   Prenatal labs: ABO, Rh: A/Positive/-- (05/26 1047) Antibody: Negative (05/26 1047) Rubella: 5.43 (05/26 1047) RPR: Non Reactive (09/30 0925)  HBsAg: Negative (05/26 1047)  HIV: Non Reactive (09/30 0926)  GBS: Negative/-- (11/23 1500)  1 hr Glucola: 119 (10/15/2019) Genetic screening: low risk (06/10/2019) Anatomy US: normal (08/05/2019)  Prenatal Transfer Tool  Maternal Diabetes: No Genetic Screening: Normal Maternal Ultrasounds/Referrals: Normal Fetal Ultrasounds or other Referrals:  Referred to Materal Fetal Medicine  Maternal Substance Abuse:  No Significant Maternal Medications:  None Significant Maternal Lab Results: Group B Strep negative  No results found for this or any previous visit (from the past 24 hour(s)).  Patient Active Problem List   Diagnosis Date Noted  . Encounter for supervision of normal first pregnancy in third trimester 06/10/2019    Assessment/Plan:  Amy Graham is a 19 y.o. G1P0 at 44w0dhere for labor s/p SROM _1 .  #Labor: Progressing well, expectant management. Will consider augmentation if appropriate.  #Pain: Fentanyl prn, refuses epidural #FWB: Category 1 #ID: GBS negative #MOF: both breast and bottle feeding  #MOC: postpartum Liletta IUD #Circ: yes  Anupa Ganta, DO  12/16/2019, 9:16 AM   I saw and evaluated the patient. I agree with the findings and the plan of care as documented in the resident's note.  JSharene Skeans MD OReg[facility] ToledoFamily Medicine Fellow, FHudson Valley Ambulatory Surgery LLCfor [facility]

## 2019-12-17 LAB — RPR: RPR Ser Ql: NONREACTIVE

## 2019-12-17 NOTE — Lactation Note (Signed)
This note was copied from a baby's chart. Lactation Consultation Note Baby 5 hrs old. Attempted to see mom. Mom sleeping. Support person in recliner, LC waved and nodded.  Patient Name: Amy Graham [id number] Date: 12/17/2019     Maternal Data    Feeding Feeding Type: Bottle Fed - Formula Nipple Type: Slow - flow  LATCH Score                   Interventions    Lactation Tools Discussed/Used     Consult Status      Peggy Monk G 12/17/2019, 1:21 AM

## 2019-12-17 NOTE — Progress Notes (Signed)
Post Partum Day #1 Subjective: no complaints, up ad lib and tolerating PO; is wanting to pump rather than latch the baby- issue reviewed; will get Liletta at [facility] visit; desires circ for her son- consent note in infant's chart  Objective: Blood pressure 114/79, pulse 84, temperature 98.4 F (36.9 C), temperature source Oral, resp. rate 16, height 5\' 5"  (1.651 m), weight 72.6 kg, last menstrual period 03/25/2019, SpO2 99 %, unknown if currently breastfeeding.  Physical Exam:  General: alert, cooperative and no distress Lochia: appropriate Uterine Fundus: firm DVT Evaluation: No evidence of DVT seen on physical exam.  Recent Labs    12/16/19 0903  HGB 11.6*  HCT 34.9*    Assessment/Plan: Plan for discharge tomorrow, Lactation consult and Circumcision prior to discharge   LOS: 1 day   14/01/21 CNM 12/17/2019, 8:22 AM

## 2019-12-17 NOTE — Lactation Note (Signed)
This note was copied from a baby's chart. Lactation Consultation Note  Patient Name: Amy Graham [id number] Date: 12/17/2019 Reason for consult: Initial assessment   Infant is 15 hours old. Mother plans to exclusively pump. She is now formula feeding.  Staff member sat up DEBP for her . She is aware ofcollection, cleaning parts and storage of breastmilk.  Mother preferred not to be shown how to hand express at this time. I demonstrated with my hand and gave her Amy Graham video site. She was also told about tips for making more milk.   She has a DEBP at home that she ordered from [employer], but doesn't know the name.   Mother knows that she needs to pump every 2-3 hours for 15 mins.    Maternal Data Has patient been taught Hand Expression?: Yes Does the patient have breastfeeding experience prior to this delivery?: No  Feeding    LATCH Score                   Interventions Interventions: DEBP  Lactation Tools Discussed/Used Pump Review: Setup, frequency, and cleaning;Milk Storage   Consult Status Consult Status: Follow-up Date: 12/18/19 Follow-up type: In-patient    Amy Graham Amy Graham - [place] 12/17/2019, 11:16 AM

## 2019-12-17 NOTE — Lactation Note (Signed)
This note was copied from a baby's chart. Lactation Consultation Note Baby 4 hrs old in CN under warmer. LC will f/u when bay returns to room.  Patient Name: Boy Tiny Chaudhary [id number] Date: 12/17/2019     Maternal Data    Feeding Feeding Type: Bottle Fed - Formula Nipple Type: Slow - flow  LATCH Score                   Interventions    Lactation Tools Discussed/Used     Consult Status      Sanam Marmo G 12/17/2019, 12:28 AM

## 2019-12-18 DIAGNOSIS — Z8759 Personal history of other complications of pregnancy, childbirth and the puerperium: Secondary | ICD-10-CM

## 2019-12-18 MED ORDER — ACETAMINOPHEN 325 MG PO TABS: 650.0000 mg | ORAL_TABLET | ORAL | 0 refills | Status: AC | PRN

## 2019-12-18 MED ORDER — IBUPROFEN 600 MG PO TABS: 600.0000 mg | ORAL_TABLET | Freq: Four times a day (QID) | ORAL | 0 refills | Status: AC

## 2019-12-18 NOTE — Progress Notes (Signed)
Post Partum Day 2 Subjective: Amy Graham  is a 19 y.o. G1P1001 s/p SVD at [redacted]w[redacted]d.  She reports she is doing well. No acute events overnight. She denies any problems with ambulating, voiding or po intake. She reports tingling in her feet, not warm to the tough, not tender or painful. Denies nausea or vomiting.  Pain is well controlled on ibuprofen.  Lochia is moderate and improving.  Objective: Blood pressure 113/76, pulse 85, temperature 98.2 F (36.8 C), temperature source Oral, resp. rate 16, height 5\' 5"  (1.651 m), weight 72.6 kg, last menstrual period 03/25/2019, SpO2 100 %, unknown if currently breastfeeding.  Physical Exam:  General: alert, cooperative, appears stated age and no distress Lochia: appropriate Uterine Fundus: firm Incision: n/a Laceration: 1st degree, perineal - healing well DVT Evaluation: No evidence of DVT seen on physical exam. No significant calf/ankle edema.  Recent Labs    12/16/19 0903  HGB 11.6*  HCT 34.9*    Assessment/Plan: Discharge home, Circumcision prior to discharge and Contraception IUD outpatient   LOS: 2 days   14/01/21 12/18/2019, 7:38 AM

## 2019-12-18 NOTE — Lactation Note (Signed)
This note was copied from a baby's chart. Lactation Consultation Note  Patient Name: Amy Graham [id number] Date: 12/18/2019   Per Hurley Cisco, RN, there is no need for lactation to see this Mom before discharge.   Lurline Hare [facility] 12/18/2019, 11:17 AM

## 2019-12-21 ENCOUNTER — Telehealth: Payer: Self-pay | Admitting: *Deleted

## 2019-12-21 NOTE — Telephone Encounter (Signed)
Pt called stating she just wasn't feeling good. She has been coughing, having the shivers, and some lightheadedness. Pt also states when she cough she bleeds more. Pt is breast and bottle feeding. Informed that she should check her temp for fever and she can take OTC meds for there cough and to monitor the bleeding when she is not coughing, as we would be concerned if she was bleeding through more than 1 pad per hour and it is normal to have more bleeding when she coughs due to the thrust. Advised that is she continues to not feel well to go to an urgent care or PCP and let us know if she has anymore concerns with her bleeding. Pt verbalizes and understands.

## 2019-12-23 ENCOUNTER — Encounter: Payer: Medicaid Other | Admitting: Advanced Practice Midwife

## 2019-12-29 ENCOUNTER — Encounter: Payer: Medicaid Other | Admitting: Obstetrics and Gynecology

## 2020-01-20 ENCOUNTER — Encounter: Payer: Self-pay | Admitting: Advanced Practice Midwife

## 2020-01-20 ENCOUNTER — Other Ambulatory Visit: Payer: Self-pay

## 2020-01-20 ENCOUNTER — Ambulatory Visit (INDEPENDENT_AMBULATORY_CARE_PROVIDER_SITE_OTHER): Payer: Medicaid Other | Admitting: Advanced Practice Midwife

## 2020-01-20 DIAGNOSIS — Z3202 Encounter for pregnancy test, result negative: Secondary | ICD-10-CM

## 2020-01-20 DIAGNOSIS — Z30430 Encounter for insertion of intrauterine contraceptive device: Secondary | ICD-10-CM | POA: Diagnosis not present

## 2020-01-20 LAB — POCT URINE PREGNANCY: Preg Test, Ur: NEGATIVE

## 2020-01-20 MED ORDER — LEVONORGESTREL 19.5 MCG/DAY IU IUD
INTRAUTERINE_SYSTEM | Freq: Once | INTRAUTERINE | Status: AC
Start: 2020-01-20 — End: 2020-01-20

## 2020-01-20 NOTE — Progress Notes (Signed)
    GYNECOLOGY OFFICE PROCEDURE NOTE  Amy Graham is a 20 y.o. G1P1001 here for IUD insertion. See separate note for postpartum visit information.  No GYN concerns.    IUD Insertion Procedure Note Patient identified, informed consent performed, consent signed.   Discussed risks of irregular bleeding, cramping, infection, malpositioning or misplacement of the IUD outside the uterus which may require further procedure such as laparoscopy. Also discussed >99% contraception efficacy, increased risk of ectopic pregnancy with failure of method.   Emphasized that this did not protect against STIs, condoms recommended during all sexual encounters. Time out was performed.  Urine pregnancy test negative.  Speculum placed in the vagina.  Cervix visualized.  Cleaned with Betadine x 2.  Grasped anteriorly with a single tooth tenaculum.  Uterus sounded to 7 cm.  Liletta IUD placed per manufacturer's recommendations.  Strings trimmed to 3 cm. Tenaculum was removed, good hemostasis noted.  Patient tolerated procedure well.   Patient was given post-procedure instructions.  She was advised to have backup contraception for one week.  Patient was also asked to check IUD strings periodically and follow up in 4 weeks for IUD check.   Clayton Bibles, MSN, CNM Certified Nurse Midwife, [facility] for [employer], [facility] 01/20/20 11:04 AM

## 2020-01-20 NOTE — Progress Notes (Signed)
    Post Partum Visit Note  Amy Graham is a 20 y.o. G45P1001 female who presents for a postpartum visit. She is 5 weeks postpartum following a normal spontaneous vaginal delivery.  I have fully reviewed the prenatal and intrapartum course. The delivery was at 38.0 gestational weeks.  Anesthesia: none. Postpartum course has been uncomplicated. Baby is doing well. Baby is feeding by bottle - Gerber Soy. Bleeding did stop and now started your cycle this week. . Bowel function is normal. Bladder function is normal. Patient is not sexually active. Contraception method is IUD. Postpartum depression screening: negative.   The pregnancy intention screening data noted above was reviewed. Potential methods of contraception were discussed. The patient elected to proceed with IUD or IUS.      The following portions of the patient's history were reviewed and updated as appropriate: allergies, current medications, past family history, past medical history, past social history, past surgical history and problem list.  Review of Systems A comprehensive review of systems was negative.    Objective:  BP 116/79   Pulse 81   Wt 136 lb (61.7 kg)   LMP 03/25/2019 (LMP Unknown) Comment: took a pregnancy test on 4/13-POSITIVE   BMI 22.63 kg/m    General:  alert, cooperative and appears stated age   Breasts:  negative  Lungs: clear to auscultation bilaterally  Heart:  regular rate and rhythm, S1, S2 normal, no murmur, click, rub or gallop  Abdomen: soft, non-tender; bowel sounds normal; no masses,  no organomegaly   Vulva:  normal  Vagina: normal vagina patient having her cycle  Cervix:  no cervical motion tenderness and no lesions  Rectal Exam: Not performed.        Assessment:    Normal postpartum exam. Pap smear not performed at today's visit. Patient turns 20 in May 2022.  Plan:   Essential components of care per ACOG recommendations:  1.  Mood and well being: Patient with negative depression  screening today. Reviewed local resources for support.  - Patient does not use tobacco. No history of drug use  2. Infant care and feeding:  -Patient currently breastmilk feeding? No -Social determinants of health (SDOH) reviewed in EPIC. No concerns  3. Sexuality, contraception and birth spacing - Patient does not know if she wants more biological children but is considering adopting in the future  4. Sleep and fatigue -Encouraged family/partner/community support of 4 hrs of uninterrupted sleep to help with mood and fatigue  5. Physical Recovery  - Discussed patients delivery and complications - Patient has urinary incontinence? No - Patient is safe to resume physical and sexual activity   Clayton Bibles, MSN, CNM Certified Nurse Midwife, [employer] for [employer], [facility] 01/20/20 7:57 PM

## 2020-01-20 NOTE — Patient Instructions (Signed)

## 2020-02-17 ENCOUNTER — Ambulatory Visit (INDEPENDENT_AMBULATORY_CARE_PROVIDER_SITE_OTHER): Payer: Medicaid Other | Admitting: Advanced Practice Midwife

## 2020-02-17 ENCOUNTER — Other Ambulatory Visit: Payer: Self-pay

## 2020-02-17 ENCOUNTER — Encounter: Payer: Self-pay | Admitting: Advanced Practice Midwife

## 2020-02-17 VITALS — BP 113/78 | HR 80

## 2020-02-17 DIAGNOSIS — Z30431 Encounter for routine checking of intrauterine contraceptive device: Secondary | ICD-10-CM

## 2020-02-17 NOTE — Progress Notes (Signed)
    GYNECOLOGY OFFICE ENCOUNTER NOTE  History:  20 y.o. G1P1001 here today for today for IUD string check; Liletta  IUD was placed  01/20/2020. No complaints about the IUD, no concerning side effects. Irregular spotting which patient understands is normal.   The following portions of the patient's history were reviewed and updated as appropriate: allergies, current medications, past family history, past medical history, past social history, past surgical history and problem list. No pap history, age 25  Review of Systems:  Pertinent items are noted in HPI.   Objective:  Physical Exam Blood pressure 113/78, pulse 80, unknown if currently breastfeeding. CONSTITUTIONAL: Well-developed, well-nourished female in no acute distress.  HENT:  Normocephalic, atraumatic. External right and left ear normal. Oropharynx is clear and moist EYES: Conjunctivae and EOM are normal. Pupils are equal, round, and reactive to light. No scleral icterus.  NECK: Normal range of motion, supple, no masses CARDIOVASCULAR: Normal heart rate noted RESPIRATORY: Effort and breath sounds normal, no problems with respiration noted ABDOMEN: Soft, no distention noted.   PELVIC: Normal appearing external genitalia; normal appearing vaginal mucosa and cervix.  IUD strings visualized, about 3 cm in length outside cervix.   Assessment & Plan:  Patient to keep IUD in place for up to seven years; can come in for removal if she desires pregnancy earlier or for any concerning side effects.  Return for first pap after 05/2020.  Clayton Bibles, MSN, CNM Certified Nurse Midwife, [employer] for [employer], [facility] 02/17/20 10:30 AM

## 2020-02-17 NOTE — Patient Instructions (Signed)
Preventive Care 21-21 Years Old, Female Preventive care refers to lifestyle choices and visits with your health care provider that can promote health and wellness. This includes:  A yearly physical exam. This is also called an annual wellness visit.  Regular dental and eye exams.  Immunizations.  Screening for certain conditions.  Healthy lifestyle choices, such as: ? Eating a healthy diet. ? Getting regular exercise. ? Not using drugs or products that contain nicotine and tobacco. ? Limiting alcohol use. What can I expect for my preventive care visit? Physical exam Your health care provider may check your:  Height and weight. These may be used to calculate your BMI (body mass index). BMI is a measurement that tells if you are at a healthy weight.  Heart rate and blood pressure.  Body temperature.  Skin for abnormal spots. Counseling Your health care provider may ask you questions about your:  Past medical problems.  Family's medical history.  Alcohol, tobacco, and drug use.  Emotional well-being.  Home life and relationship well-being.  Sexual activity.  Diet, exercise, and sleep habits.  Work and work environment.  Access to firearms.  Method of birth control.  Menstrual cycle.  Pregnancy history. What immunizations do I need? Vaccines are usually given at various ages, according to a schedule. Your health care provider will recommend vaccines for you based on your age, medical history, and lifestyle or other factors, such as travel or where you work.   What tests do I need? Blood tests  Lipid and cholesterol levels. These may be checked every 5 years starting at age 20.  Hepatitis C test.  Hepatitis B test. Screening  Diabetes screening. This is done by checking your blood sugar (glucose) after you have not eaten for a while (fasting).  STD (sexually transmitted disease) testing, if you are at risk.  BRCA-related cancer screening. This may be  done if you have a family history of breast, ovarian, tubal, or peritoneal cancers.  Pelvic exam and Pap test. This may be done every 3 years starting at age 21. Starting at age 30, this may be done every 5 years if you have a Pap test in combination with an HPV test. Talk with your health care provider about your test results, treatment options, and if necessary, the need for more tests.   Follow these instructions at home: Eating and drinking  Eat a healthy diet that includes fresh fruits and vegetables, whole grains, lean protein, and low-fat dairy products.  Take vitamin and mineral supplements as recommended by your health care provider.  Do not drink alcohol if: ? Your health care provider tells you not to drink. ? You are pregnant, may be pregnant, or are planning to become pregnant.  If you drink alcohol: ? Limit how much you have to 0-1 drink a day. ? Be aware of how much alcohol is in your drink. In the [place] one drink equals one 12 oz bottle of beer (355 mL), one 5 oz glass of wine (148 mL), or one 1 oz glass of hard liquor (44 mL).   Lifestyle  Take daily care of your teeth and gums. Brush your teeth every morning and night with fluoride toothpaste. Floss one time each day.  Stay active. Exercise for at least 30 minutes 5 or more days each week.  Do not use any products that contain nicotine or tobacco, such as cigarettes, e-cigarettes, and chewing tobacco. If you need help quitting, ask your health care provider.  Do not   use drugs.  If you are sexually active, practice safe sex. Use a condom or other form of protection to prevent STIs (sexually transmitted infections).  If you do not wish to become pregnant, use a form of birth control. If you plan to become pregnant, see your health care provider for a prepregnancy visit.  Find healthy ways to cope with stress, such as: ? Meditation, yoga, or listening to music. ? Journaling. ? Talking to a trusted  person. ? Spending time with friends and family. Safety  Always wear your seat belt while driving or riding in a vehicle.  Do not drive: ? If you have been drinking alcohol. Do not ride with someone who has been drinking. ? When you are tired or distracted. ? While texting.  Wear a helmet and other protective equipment during sports activities.  If you have firearms in your house, make sure you follow all gun safety procedures.  Seek help if you have been physically or sexually abused. What's next?  Go to your health care provider once a year for an annual wellness visit.  Ask your health care provider how often you should have your eyes and teeth checked.  Stay up to date on all vaccines. This information is not intended to replace advice given to you by your health care provider. Make sure you discuss any questions you have with your health care provider. Document Revised: 08/30/2019 Document Reviewed: 09/12/2017 Elsevier Patient Education  2021 Elsevier Inc.  

## 2020-04-06 ENCOUNTER — Encounter: Payer: Self-pay | Admitting: Radiology

## 2020-05-25 ENCOUNTER — Ambulatory Visit: Payer: Medicaid Other | Admitting: Advanced Practice Midwife

## 2020-06-08 ENCOUNTER — Ambulatory Visit (INDEPENDENT_AMBULATORY_CARE_PROVIDER_SITE_OTHER): Payer: Medicaid Other | Admitting: Advanced Practice Midwife

## 2020-06-08 ENCOUNTER — Encounter: Payer: Self-pay | Admitting: Advanced Practice Midwife

## 2020-06-08 ENCOUNTER — Other Ambulatory Visit: Payer: Self-pay

## 2020-06-08 ENCOUNTER — Other Ambulatory Visit (HOSPITAL_COMMUNITY)
Admission: RE | Admit: 2020-06-08 | Discharge: 2020-06-08 | Disposition: A | Discharge status: Home / Self Care | Payer: Medicaid Other | Source: Ambulatory Visit | Attending: Advanced Practice Midwife | Admitting: Advanced Practice Midwife

## 2020-06-08 VITALS — BP 111/72 | HR 78 | Ht 65.0 in | Wt 137.6 lb

## 2020-06-08 DIAGNOSIS — Z30431 Encounter for routine checking of intrauterine contraceptive device: Secondary | ICD-10-CM

## 2020-06-08 DIAGNOSIS — N898 Other specified noninflammatory disorders of vagina: Secondary | ICD-10-CM

## 2020-06-08 DIAGNOSIS — Z01419 Encounter for gynecological examination (general) (routine) without abnormal findings: Secondary | ICD-10-CM | POA: Diagnosis present

## 2020-06-08 NOTE — Patient Instructions (Signed)
Preventive Care 21-21 Years Old, Female Preventive care refers to lifestyle choices and visits with your health care provider that can promote health and wellness. This includes:  A yearly physical exam. This is also called an annual wellness visit.  Regular dental and eye exams.  Immunizations.  Screening for certain conditions.  Healthy lifestyle choices, such as: ? Eating a healthy diet. ? Getting regular exercise. ? Not using drugs or products that contain nicotine and tobacco. ? Limiting alcohol use. What can I expect for my preventive care visit? Physical exam Your health care provider may check your:  Height and weight. These may be used to calculate your BMI (body mass index). BMI is a measurement that tells if you are at a healthy weight.  Heart rate and blood pressure.  Body temperature.  Skin for abnormal spots. Counseling Your health care provider may ask you questions about your:  Past medical problems.  Family's medical history.  Alcohol, tobacco, and drug use.  Emotional well-being.  Home life and relationship well-being.  Sexual activity.  Diet, exercise, and sleep habits.  Work and work environment.  Access to firearms.  Method of birth control.  Menstrual cycle.  Pregnancy history. What immunizations do I need? Vaccines are usually given at various ages, according to a schedule. Your health care provider will recommend vaccines for you based on your age, medical history, and lifestyle or other factors, such as travel or where you work.   What tests do I need? Blood tests  Lipid and cholesterol levels. These may be checked every 5 years starting at age 20.  Hepatitis C test.  Hepatitis B test. Screening  Diabetes screening. This is done by checking your blood sugar (glucose) after you have not eaten for a while (fasting).  STD (sexually transmitted disease) testing, if you are at risk.  BRCA-related cancer screening. This may be  done if you have a family history of breast, ovarian, tubal, or peritoneal cancers.  Pelvic exam and Pap test. This may be done every 3 years starting at age 21. Starting at age 30, this may be done every 5 years if you have a Pap test in combination with an HPV test. Talk with your health care provider about your test results, treatment options, and if necessary, the need for more tests.   Follow these instructions at home: Eating and drinking  Eat a healthy diet that includes fresh fruits and vegetables, whole grains, lean protein, and low-fat dairy products.  Take vitamin and mineral supplements as recommended by your health care provider.  Do not drink alcohol if: ? Your health care provider tells you not to drink. ? You are pregnant, may be pregnant, or are planning to become pregnant.  If you drink alcohol: ? Limit how much you have to 0-1 drink a day. ? Be aware of how much alcohol is in your drink. In the [place] one drink equals one 12 oz bottle of beer (355 mL), one 5 oz glass of wine (148 mL), or one 1 oz glass of hard liquor (44 mL).   Lifestyle  Take daily care of your teeth and gums. Brush your teeth every morning and night with fluoride toothpaste. Floss one time each day.  Stay active. Exercise for at least 30 minutes 5 or more days each week.  Do not use any products that contain nicotine or tobacco, such as cigarettes, e-cigarettes, and chewing tobacco. If you need help quitting, ask your health care provider.  Do not   use drugs.  If you are sexually active, practice safe sex. Use a condom or other form of protection to prevent STIs (sexually transmitted infections).  If you do not wish to become pregnant, use a form of birth control. If you plan to become pregnant, see your health care provider for a prepregnancy visit.  Find healthy ways to cope with stress, such as: ? Meditation, yoga, or listening to music. ? Journaling. ? Talking to a trusted  person. ? Spending time with friends and family. Safety  Always wear your seat belt while driving or riding in a vehicle.  Do not drive: ? If you have been drinking alcohol. Do not ride with someone who has been drinking. ? When you are tired or distracted. ? While texting.  Wear a helmet and other protective equipment during sports activities.  If you have firearms in your house, make sure you follow all gun safety procedures.  Seek help if you have been physically or sexually abused. What's next?  Go to your health care provider once a year for an annual wellness visit.  Ask your health care provider how often you should have your eyes and teeth checked.  Stay up to date on all vaccines. This information is not intended to replace advice given to you by your health care provider. Make sure you discuss any questions you have with your health care provider. Document Revised: 08/30/2019 Document Reviewed: 09/12/2017 Elsevier Patient Education  2021 Elsevier Inc.  

## 2020-06-08 NOTE — Progress Notes (Signed)
GYNECOLOGY ANNUAL PREVENTATIVE CARE ENCOUNTER NOTE  History:     Nioma Mccubbins is a 20 y.o. G37P1001 female here for a routine annual gynecologic exam.  Current complaints: none.   Denies abnormal vaginal bleeding, discharge, pelvic pain, problems with intercourse or other gynecologic concerns.    Sexually active, one female partner, + penetrative sex. Planning to join [employer] next year. Bottle feeding six month old.   Gynecologic History No LMP recorded. (Menstrual status: IUD). Contraception: IUD Last Pap: First pap today.   Obstetric History OB History  Gravida Para Term Preterm AB Living  1 1 1     1   SAB IAB Ectopic Multiple Live Births        0 1    # Outcome Date GA Lbr Len/2nd Weight Sex Delivery Anes PTL Lv  1 Term 12/16/19 [redacted]w[redacted]d 10:27 / 01:28 7 lb 9.2 oz (3.436 kg) M Vag-Spont Local  LIV    Past Medical History:  Diagnosis Date  . Normal labor 12/16/2019    No past surgical history on file.  Current Outpatient Medications on File Prior to Visit  Medication Sig Dispense Refill  . acetaminophen (TYLENOL) 325 MG tablet Take 2 tablets (650 mg total) by mouth every 4 (four) hours as needed (for pain scale < 4). (Patient not taking: Reported on 06/08/2020) 30 tablet 0  . ibuprofen (ADVIL) 600 MG tablet Take 1 tablet (600 mg total) by mouth every 6 (six) hours. (Patient not taking: Reported on 06/08/2020) 30 tablet 0  . Prenatal Vit-Fe Fumarate-FA (PRENATAL MULTIVITAMIN) TABS tablet Take 1 tablet by mouth daily at 12 noon. (Patient not taking: Reported on 06/08/2020) 30 tablet 2   No current facility-administered medications on file prior to visit.    No Known Allergies  Social History:  reports that she has never smoked. She has never used smokeless tobacco. She reports that she does not drink alcohol and does not use drugs.  Family History  Problem Relation Age of Onset  . Diabetes Mother   . Healthy Father     The following portions of the patient's history were  reviewed and updated as appropriate: allergies, current medications, past family history, past medical history, past social history, past surgical history and problem list.  Review of Systems Pertinent items noted in HPI and remainder of comprehensive ROS otherwise negative.  Physical Exam:  BP 111/72   Pulse 78   Ht 5\' 5"  (1.651 m)   Wt 137 lb 9.6 oz (62.4 kg)   BMI 22.90 kg/m  CONSTITUTIONAL: Well-developed, well-nourished female in no acute distress.  HENT:  Normocephalic, atraumatic, External right and left ear normal.  EYES: Conjunctivae and EOM are normal. Pupils are equal, round, and reactive to light. No scleral icterus.  NECK: Normal range of motion, supple, no masses.  Normal thyroid.  SKIN: Skin is warm and dry. No rash noted. Not diaphoretic. No erythema. No pallor. MUSCULOSKELETAL: Normal range of motion. No tenderness.  No cyanosis, clubbing, or edema. NEUROLOGIC: Alert and oriented to person, place, and time. Normal reflexes, muscle tone coordination.  PSYCHIATRIC: Normal mood and affect. Normal behavior. Normal judgment and thought content. CARDIOVASCULAR: Normal heart rate noted, regular rhythm RESPIRATORY: Clear to auscultation bilaterally. Effort and breath sounds normal, no problems with respiration noted. BREASTS: Symmetric in size. No masses, tenderness, skin changes, nipple drainage, or lymphadenopathy bilaterally. Performed in the presence of a chaperone. ABDOMEN: Soft, no distention noted.  No tenderness, rebound or guarding.  PELVIC: Normal appearing external genitalia  and urethral meatus; normal appearing vaginal mucosa and cervix.  Pervasive white clumped discharge c/w yeast.  Pap smear obtained.   Performed in the presence of a chaperone.   Assessment and Plan:    1. Well woman exam with routine gynecological exam - No abnormal findings beyond discharge - Cytology - PAP  2. Vaginal discharge  - Cervicovaginal ancillary only  3. IUD check up - Strings  visualized  Will follow up results of pap smear and manage accordingly. Routine preventative health maintenance measures emphasized. Please refer to After Visit Summary for other counseling recommendations.      Clayton Bibles, MSN, CNM Certified Nurse Midwife, [employer] for [employer], [facility] 06/08/20 1:03 PM

## 2020-06-09 LAB — CERVICOVAGINAL ANCILLARY ONLY
Bacterial Vaginitis (gardnerella): POSITIVE — AB
Candida Glabrata: NEGATIVE
Candida Vaginitis: NEGATIVE
Chlamydia: NEGATIVE
Comment: NEGATIVE
Comment: NEGATIVE
Comment: NEGATIVE
Comment: NEGATIVE
Comment: NEGATIVE
Comment: NORMAL
Neisseria Gonorrhea: NEGATIVE
Trichomonas: NEGATIVE

## 2020-06-10 LAB — CYTOLOGY - PAP
Adequacy: ABSENT
Diagnosis: NEGATIVE

## 2020-06-14 ENCOUNTER — Other Ambulatory Visit: Payer: Self-pay | Admitting: *Deleted

## 2020-06-14 MED ORDER — METRONIDAZOLE 500 MG PO TABS: 500.0000 mg | ORAL_TABLET | Freq: Two times a day (BID) | ORAL | 0 refills | Status: AC

## 2020-08-20 IMAGING — US US MFM OB COMP +14 WKS
1 series · 13 of 28 positions shown · non-contrast
Comparison: none

[Series 1: us mfm ob comp +14 wks · 13 of 96 slices shown]
[im 4/96]
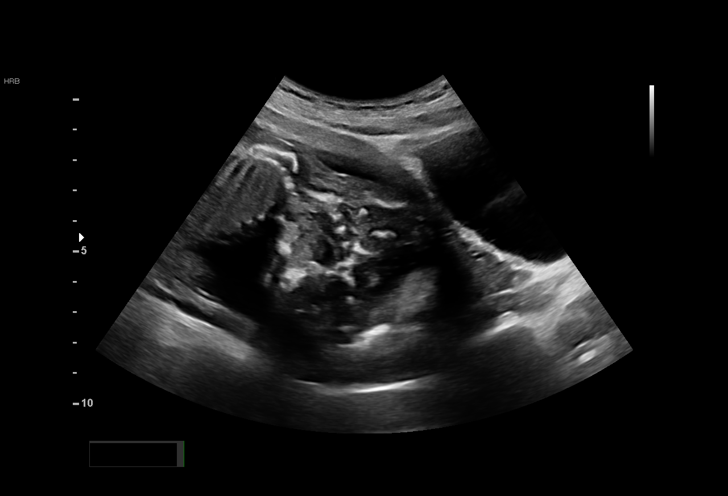
[im 11/96]
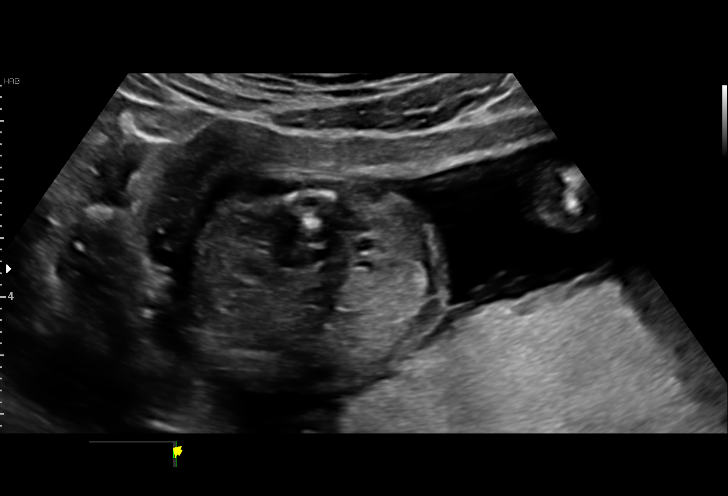
[im 18/96]
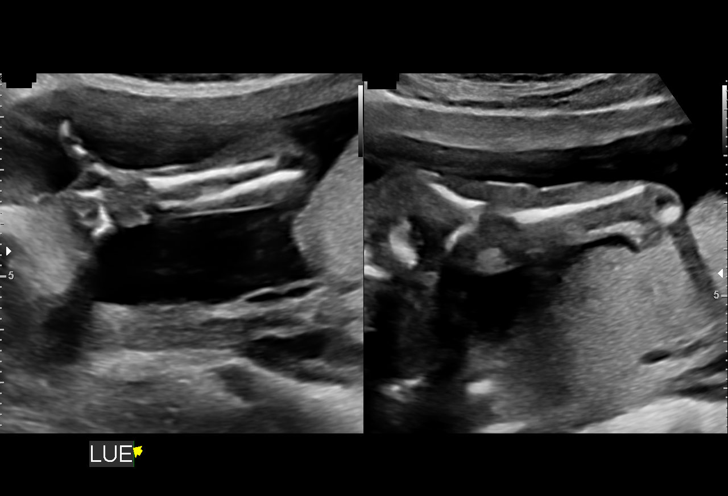
[im 25/96]
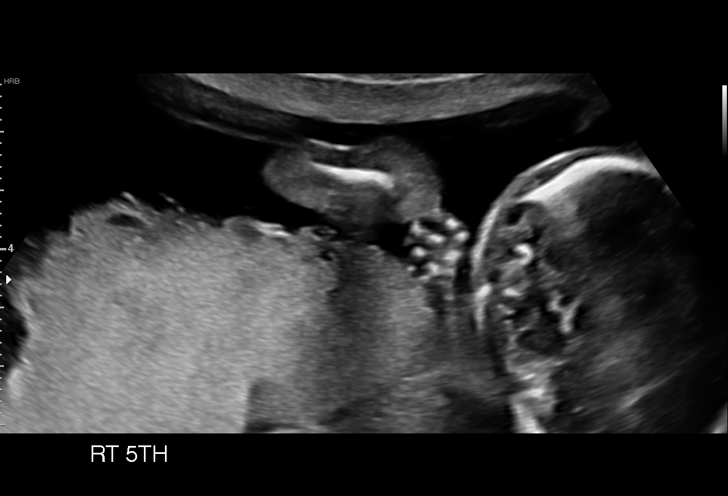
[im 32/96]
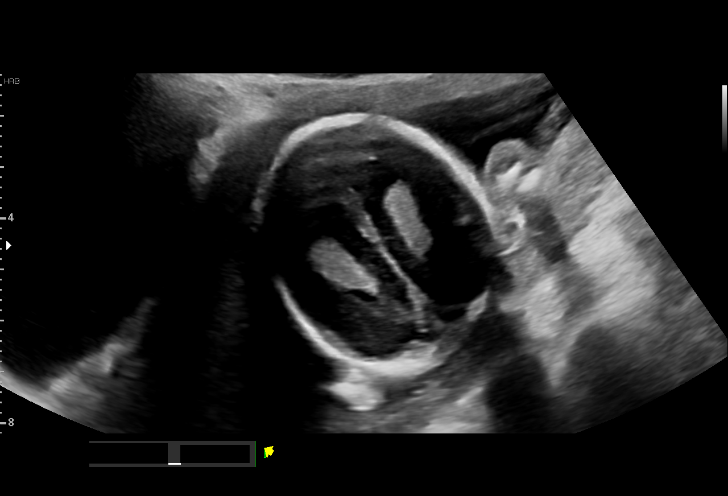
[im 39/96]
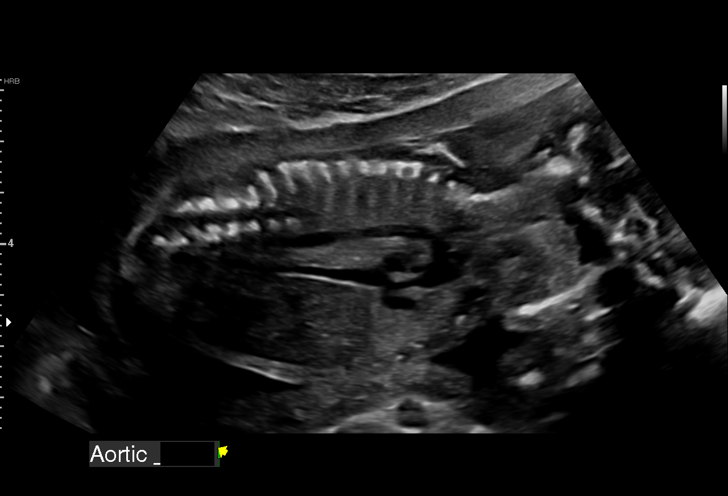
[im 50/96]
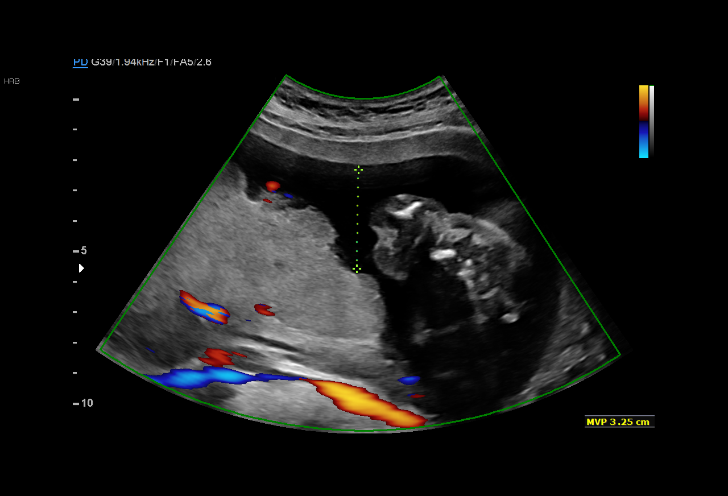
[im 57/96]
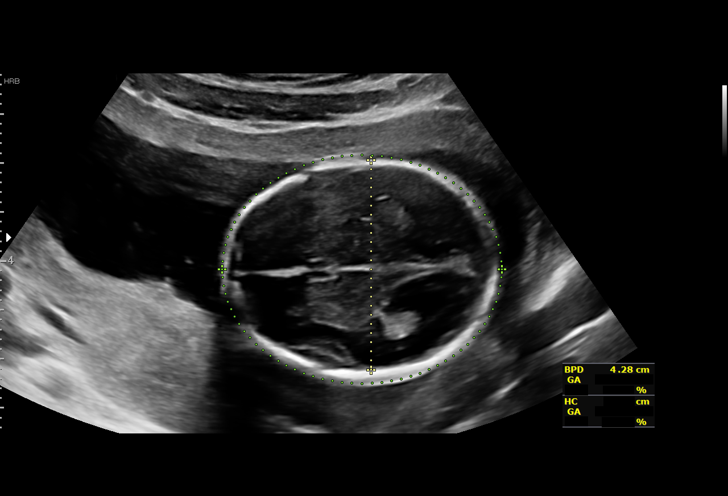
[im 64/96]
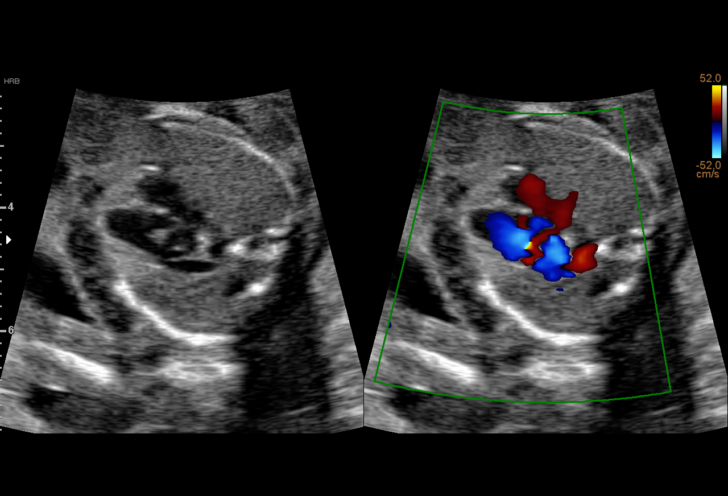
[im 71/96]
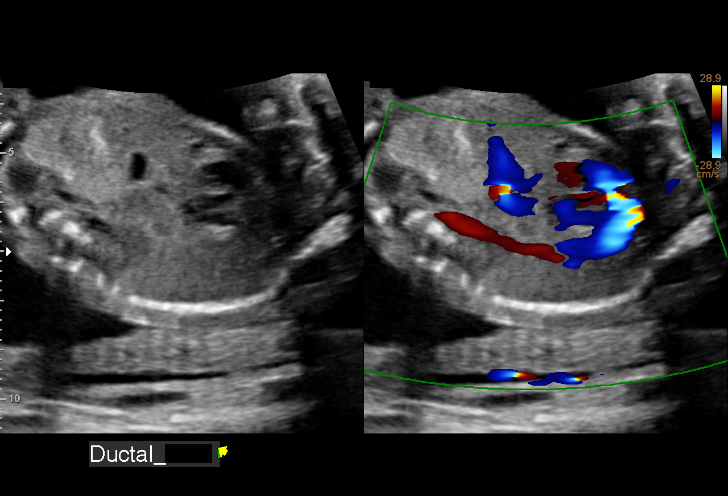
[im 78/96]
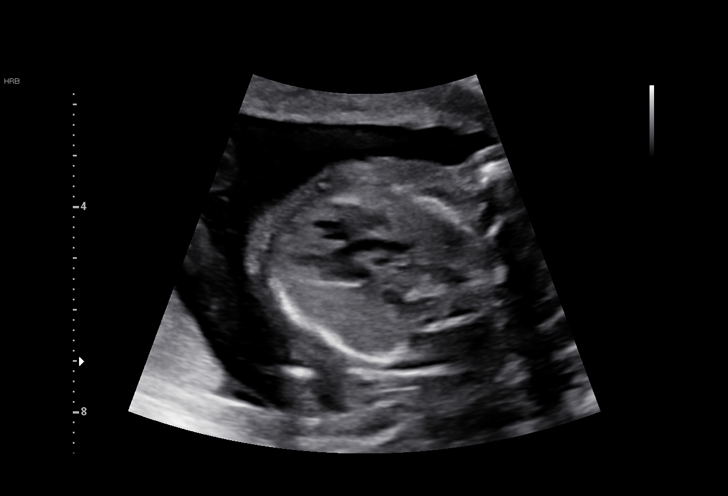
[im 85/96]
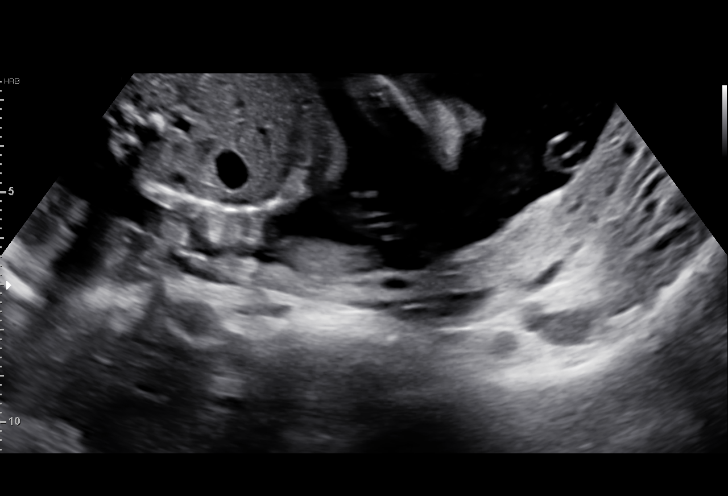
[im 92/96]
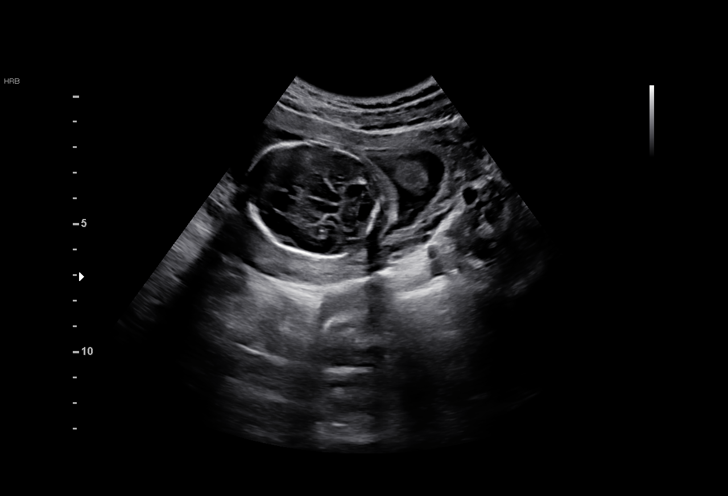

[13 of 28 positions shown; findings below may reference images not displayed]

CAUDIA

                                                      CAUDIA

Indications

 Encounter for antenatal screening for
 malformations (low risk NIPS, neg AFP)
 19 weeks gestation of pregnancy
Vital Signs

 BMI:
Fetal Evaluation

 Num Of Fetuses:         1
 Fetal Heart Rate(bpm):  155
 Cardiac Activity:       Observed
 Presentation:           Cephalic
 Placenta:               Posterior
 P. Cord Insertion:      Visualized, central

 Amniotic Fluid
 AFI FV:      Within normal limits

                             Largest Pocket(cm)

Biometry

 BPD:        43  mm     G. Age:  19w 0d         51  %    CI:        70.55   %    70 - 86
                                                         FL/HC:      17.6   %    16.1 -
 HC:      163.2  mm     G. Age:  19w 1d         46  %    HC/AC:      1.15        1.09 -
 AC:      142.1  mm     G. Age:  19w 4d         65  %    FL/BPD:     67.0   %
 FL:       28.8  mm     G. Age:  18w 6d         37  %    FL/AC:      20.3   %    20 - 24
 CER:      19.6  mm     G. Age:  19w 0d         36  %
 NFT:       5.6  mm

 LV:        5.3  mm
 CM:        4.5  mm

 Est. FW:     280  gm    0 lb 10 oz      58  %
OB History

 Gravidity:    1
Gestational Age

 LMP:           19w 0d        Date:  03/25/19                 EDD:   12/30/19
 U/S Today:     19w 1d                                        EDD:   12/29/19
 Best:          19w 0d     Det. By:  LMP  (03/25/19)          EDD:   12/30/19
Anatomy

 Cranium:               Appears normal         Aortic Arch:            Appears normal
 Cavum:                 Appears normal         Ductal Arch:            Appears normal
 Ventricles:            Appears normal         Diaphragm:              Appears normal
 Choroid Plexus:        Appears normal         Stomach:                Appears normal, left
                                                                       sided
 Cerebellum:            Appears normal         Abdomen:                Appears normal
 Posterior Fossa:       Appears normal         Abdominal Wall:         Appears nml (cord
                                                                       insert, abd wall)
 Nuchal Fold:           Appears normal         Cord Vessels:           Appears normal (3
                                                                       vessel cord)
 Face:                  Appears normal         Kidneys:                Appear normal
                        (orbits and profile)
 Lips:                  Appears normal         Bladder:                Appears normal
 Thoracic:              Appears normal         Spine:                  Appears normal
 Heart:                 Appears normal         Upper Extremities:      Appears normal
                        (4CH, axis, and
                        situs)
 RVOT:                  Appears normal         Lower Extremities:      Appears normal
 LVOT:                  Appears normal

 Other:  Fetus appears to be a male. Heels and 5th digit visualized. Open
         hands visualized. Nasal bone visualized.
Cervix Uterus Adnexa

 Cervix
 Length:            3.6  cm.
 Normal appearance by transabdominal scan.

 Uterus
 No abnormality visualized.

 Right Ovary
 No adnexal mass visualized.

 Left Ovary
 No adnexal mass visualized.
 Cul De Sac
 No free fluid seen.

 Adnexa
 No abnormality visualized.
Comments

 This patient was seen for a detailed fetal anatomy scan.
 She denies any significant past medical history and denies
 any problems in her current pregnancy.
 She had a cell free DNA test earlier in her pregnancy which
 indicated a low risk for trisomy 21, 18, and 13. A male fetus is
 predicted.
 She was informed that the fetal growth and amniotic fluid
 level were appropriate for her gestational age.
 There were no obvious fetal anomalies noted on today's
 ultrasound exam.
 The patient was informed that anomalies may be missed due
 to technical limitations. If the fetus is in a suboptimal position
 or maternal habitus is increased, visualization of the fetus in
 the maternal uterus may be impaired.
 Follow up as indicated.

## 2021-11-23 ENCOUNTER — Ambulatory Visit (HOSPITAL_COMMUNITY)
Admission: EM | Admit: 2021-11-23 | Discharge: 2021-11-23 | Disposition: A | Discharge status: Home / Self Care | Payer: Medicaid Other | Attending: Emergency Medicine | Admitting: Emergency Medicine

## 2021-11-23 ENCOUNTER — Encounter (HOSPITAL_COMMUNITY): Payer: Self-pay

## 2021-11-23 DIAGNOSIS — B349 Viral infection, unspecified: Secondary | ICD-10-CM | POA: Diagnosis present

## 2021-11-23 DIAGNOSIS — J029 Acute pharyngitis, unspecified: Secondary | ICD-10-CM

## 2021-11-23 DIAGNOSIS — Z1152 Encounter for screening for COVID-19: Secondary | ICD-10-CM | POA: Insufficient documentation

## 2021-11-23 LAB — RESP PANEL BY RT-PCR (FLU A&B, COVID) ARPGX2
Influenza A by PCR: NEGATIVE
Influenza B by PCR: NEGATIVE
SARS Coronavirus 2 by RT PCR: NEGATIVE

## 2021-11-23 LAB — POCT RAPID STREP A, ED / UC: Streptococcus, Group A Screen (Direct): NEGATIVE

## 2021-11-23 NOTE — ED Provider Notes (Signed)
[name]    CSN: [id number] Arrival date & time: 11/23/21  1859     History   Chief Complaint Chief Complaint  Patient presents with   Cough   Headache   Shortness of Breath    HPI Amy Graham is a 21 y.o. female.  Presents with 3-day history of URI symptoms Started with runny nose, slight sore throat, cough Headache, body aches, chills No fever, no GI symptoms Feels like symptoms worsened yesterday, more nasal congestion  Son was sick recently with runny nose  Took Mucinex once yesterday, no other medications  Works at [employer]  Past Medical History:  Diagnosis Date   Normal labor 12/16/2019    Patient Active Problem List   Diagnosis Date Noted   Vaginal delivery 01/20/2020   Encounter for IUD insertion 01/20/2020   Encounter for supervision of normal first pregnancy in third trimester 06/10/2019    History reviewed. No pertinent surgical history.  OB History     Gravida  1   Para  1   Term  1   Preterm      AB      Living  1      SAB      IAB      Ectopic      Multiple  0   Live Births  1            Home Medications    Prior to Admission medications   Medication Sig Start Date End Date Taking? Authorizing Provider  metroNIDAZOLE (FLAGYL) 500 MG tablet Take 1 tablet (500 mg total) by mouth 2 (two) times daily. 06/14/20   Darlina Rumpf, CNM  acetaminophen (TYLENOL) 325 MG tablet Take 2 tablets (650 mg total) by mouth every 4 (four) hours as needed (for pain scale < 4). Patient not taking: Reported on 06/08/2020 12/18/19   Gabriel Carina, CNM  ibuprofen (ADVIL) 600 MG tablet Take 1 tablet (600 mg total) by mouth every 6 (six) hours. Patient not taking: Reported on 06/08/2020 12/18/19   Gabriel Carina, CNM  Prenatal Vit-Fe Fumarate-FA (PRENATAL MULTIVITAMIN) TABS tablet Take 1 tablet by mouth daily at 12 noon. Patient not taking: Reported on 06/08/2020 05/09/19   Vanessa Kick, MD    Family History Family  History  Problem Relation Age of Onset   Diabetes Mother    Healthy Father     Social History Social History   Tobacco Use   Smoking status: Never   Smokeless tobacco: Never  Substance Use Topics   Alcohol use: No   Drug use: No     Allergies   Patient has no known allergies.   Review of Systems Review of Systems Per HPI  Physical Exam Triage Vital Signs ED Triage Vitals  Enc Vitals Group     BP --      Pulse Rate 11/23/21 1950 98     Resp 11/23/21 1950 16     Temp 11/23/21 1950 98.6 F (37 C)     Temp Source 11/23/21 1950 Oral     SpO2 11/23/21 1950 96 %     Weight --      Height --      Head Circumference --      Peak Flow --      Pain Score 11/23/21 1948 8     Pain Loc --      Pain Edu? --      Excl. in GC? --    No  data found.  Updated Vital Signs Pulse 98   Temp 98.6 F (37 C) (Oral)   Resp 16   LMP 11/17/2021 (Approximate)   SpO2 96%   Breastfeeding No    Physical Exam Vitals and nursing note reviewed.  Constitutional:      General: She is not in acute distress.    Appearance: She is not ill-appearing.  HENT:     Nose: Congestion and rhinorrhea present.     Mouth/Throat:     Mouth: Mucous membranes are moist.     Pharynx: Uvula midline. Posterior oropharyngeal erythema present.     Tonsils: No tonsillar exudate or tonsillar abscesses.     Comments: Mild erythema Eyes:     Conjunctiva/sclera: Conjunctivae normal.     Pupils: Pupils are equal, round, and reactive to light.  Cardiovascular:     Rate and Rhythm: Normal rate and regular rhythm.     Heart sounds: Normal heart sounds.  Pulmonary:     Effort: Pulmonary effort is normal.     Breath sounds: Normal breath sounds.  Musculoskeletal:     Cervical back: Normal range of motion.  Lymphadenopathy:     Cervical: No cervical adenopathy.  Skin:    General: Skin is warm and dry.  Neurological:     Mental Status: She is alert and oriented to person, place, and time.      UC  Treatments / Results  Labs (all labs ordered are listed, but only abnormal results are displayed) Labs Reviewed  RESP PANEL BY RT-PCR (FLU A&B, COVID) [id number]  POCT RAPID STREP A, ED / UC    EKG   Radiology No results found.  Procedures Procedures   Medications Ordered in UC Medications - No data to display  Initial Impression / Assessment and Plan / UC Course  I have reviewed the triage vital signs and the nursing notes.  Pertinent labs & imaging results that were available during my care of the patient were reviewed by me and considered in my medical decision making (see chart for details).  Strep negative  Well-appearing, afebrile Likely viral etiology COVID/flu pending per patient request Discussed symptomatic care Recommend Mucinex twice daily, can use Tylenol or ibuprofen, lots of fluids, nasal spray Return precautions discussed. Patient agrees to plan   Final Clinical Impressions(s) / UC Diagnoses   Final diagnoses:  Viral illness     Discharge Instructions      I recommend symptomatic care at home.  You can use Mucinex twice daily to help with cough and congestion.  Alternate Tylenol and ibuprofen as needed for pain and body aches. Make sure you are drinking lots of fluids!  We will call you if your covid/flu test returns positive.   Return with any questions or concerns or if symptoms persist.    ED Prescriptions   None    PDMP not reviewed this encounter.   Kathrine Haddock 11/23/21 2034

## 2021-11-23 NOTE — Discharge Instructions (Addendum)
I recommend symptomatic care at home.  You can use Mucinex twice daily to help with cough and congestion.  Alternate Tylenol and ibuprofen as needed for pain and body aches. Make sure you are drinking lots of fluids!  We will call you if your covid/flu test returns positive.   Return with any questions or concerns or if symptoms persist.

## 2021-11-23 NOTE — ED Triage Notes (Signed)
Onset of symptoms Monday and then continued to get worse.  Patient having chills, SOB, Cough, headaches, slight sore throat with cough, runny nose.  Patient states her son was sick, he was not seen or tested.

## 2021-11-23 NOTE — ED Notes (Signed)
Discharged by Yvonne, CMA.  

## 2022-04-18 NOTE — Progress Notes (Signed)
 Amy Graham is a 22 y.o. female Presents for MMR and Varicella titers

## 2022-06-24 ENCOUNTER — Other Ambulatory Visit: Payer: Self-pay

## 2022-06-24 ENCOUNTER — Emergency Department (HOSPITAL_COMMUNITY)
Admission: EM | Admit: 2022-06-24 | Discharge: 2022-06-25 | Disposition: A | Discharge status: Home / Self Care | Payer: Medicaid Other | Attending: Emergency Medicine | Admitting: Emergency Medicine

## 2022-06-24 ENCOUNTER — Emergency Department (HOSPITAL_COMMUNITY): Payer: Medicaid Other

## 2022-06-24 DIAGNOSIS — K219 Gastro-esophageal reflux disease without esophagitis: Secondary | ICD-10-CM | POA: Diagnosis not present

## 2022-06-24 DIAGNOSIS — R0789 Other chest pain: Secondary | ICD-10-CM

## 2022-06-24 DIAGNOSIS — R079 Chest pain, unspecified: Secondary | ICD-10-CM | POA: Diagnosis present

## 2022-06-24 LAB — CBC
HCT: 41 % (ref 36.0–46.0)
Hemoglobin: 13.5 g/dL (ref 12.0–15.0)
MCH: 29.5 pg (ref 26.0–34.0)
MCHC: 32.9 g/dL (ref 30.0–36.0)
MCV: 89.5 fL (ref 80.0–100.0)
Platelets: 296 10*3/uL (ref 150–400)
RBC: 4.58 MIL/uL (ref 3.87–5.11)
RDW: 12.2 % (ref 11.5–15.5)
WBC: 4.5 10*3/uL (ref 4.0–10.5)
nRBC: 0 % (ref 0.0–0.2)

## 2022-06-24 LAB — TROPONIN I (HIGH SENSITIVITY): Troponin I (High Sensitivity): 2 ng/L (ref <18)

## 2022-06-24 LAB — BASIC METABOLIC PANEL
Anion gap: 8 (ref 5–15)
BUN: 10 mg/dL (ref 6–20)
CO2: 23 mmol/L (ref 22–32)
Calcium: 8.4 mg/dL — ABNORMAL LOW (ref 8.9–10.3)
Chloride: 105 mmol/L (ref 98–111)
Creatinine, Ser: 0.66 mg/dL (ref 0.44–1.00)
GFR, Estimated: >60 mL/min (ref >60)
Glucose, Bld: 102 mg/dL — ABNORMAL HIGH (ref 70–99)
Potassium: 3.6 mmol/L (ref 3.5–5.1)
Sodium: 136 mmol/L (ref 135–145)

## 2022-06-24 LAB — I-STAT BETA HCG BLOOD, ED (MC, WL, AP ONLY): I-stat hCG, quantitative: <5 m[IU]/mL (ref <5)

## 2022-06-24 NOTE — ED Triage Notes (Signed)
Patient coming to ED for evaluation of chest pain.  Reports she recently had five teeth pulled and was started on antibiotics. Noticed pain started after dental procedure.  No recent travel.

## 2022-06-25 NOTE — ED Provider Notes (Signed)
EMERGENCY DEPARTMENT AT [facility] Provider Note   CSN: [id number] Arrival date & time: 06/24/22  2209     History  Chief Complaint  Patient presents with   Chest Pain    Amy Graham is a 22 y.o. female.  The history is provided by the patient.  Chest Pain Amy Graham is a 22 y.o. female who presents to the Emergency Department complaining of chest pain.  She presents to the emergency department for evaluation of chest pain that started yesterday.  It is described as a central pressure with some associated shortness of breath.  No change with movement or activity.  No associated fever.  She had nausea earlier that is now resolved.  No cough, leg swelling or pain, abdominal pain.  She states she might have some reflux but is not sure.  She did have 5 teeth pulled on Friday under anesthesia.  She has been taking penicillin and ibuprofen for this.   No medical problems. Has liletta IUD No tobacco, alcohol, drugs.       Home Medications Prior to Admission medications   Medication Sig Start Date End Date Taking? Authorizing Provider  metroNIDAZOLE (FLAGYL) 500 MG tablet Take 1 tablet (500 mg total) by mouth 2 (two) times daily. 06/14/20   Calvert Cantor, CNM  acetaminophen (TYLENOL) 325 MG tablet Take 2 tablets (650 mg total) by mouth every 4 (four) hours as needed (for pain scale < 4). Patient not taking: Reported on 06/08/2020 12/18/19   Bernerd Limbo, CNM  ibuprofen (ADVIL) 600 MG tablet Take 1 tablet (600 mg total) by mouth every 6 (six) hours. Patient not taking: Reported on 06/08/2020 12/18/19   Bernerd Limbo, CNM  Prenatal Vit-Fe Fumarate-FA (PRENATAL MULTIVITAMIN) TABS tablet Take 1 tablet by mouth daily at 12 noon. Patient not taking: Reported on 06/08/2020 05/09/19   Mardella Layman, MD      Allergies    Patient has no known allergies.    Review of Systems   Review of Systems  Cardiovascular:  Positive for chest pain.  All other systems  reviewed and are negative.   Physical Exam Updated Vital Signs BP 122/82   Pulse 62   Temp 97.9 F (36.6 C)   Resp 15   SpO2 100%  Physical Exam Vitals and nursing note reviewed.  Constitutional:      Appearance: She is well-developed.  HENT:     Head: Normocephalic and atraumatic.     Mouth/Throat:     Mouth: Mucous membranes are moist.     Pharynx: No posterior oropharyngeal erythema.  Cardiovascular:     Rate and Rhythm: Normal rate and regular rhythm.     Heart sounds: No murmur heard. Pulmonary:     Effort: Pulmonary effort is normal. No respiratory distress.     Breath sounds: Normal breath sounds.  Abdominal:     Palpations: Abdomen is soft.     Tenderness: There is no abdominal tenderness. There is no guarding or rebound.  Musculoskeletal:        General: No swelling or tenderness.  Skin:    General: Skin is warm and dry.  Neurological:     Mental Status: She is alert and oriented to person, place, and time.  Psychiatric:        Behavior: Behavior normal.     ED Results / Procedures / Treatments   Labs (all labs ordered are listed, but only abnormal results are displayed) Labs Reviewed  BASIC METABOLIC  PANEL - Abnormal; Notable for the following components:      Result Value   Glucose, Bld 102 (*)    Calcium 8.4 (*)    All other components within normal limits  CBC  I-STAT BETA HCG BLOOD, ED ([facility], [facility], [facility] ONLY)  TROPONIN I (HIGH SENSITIVITY)    EKG EKG Interpretation  Date/Time:  Sunday June 24 2022 22:25:15 EDT Ventricular Rate:  68 PR Interval:  130 QRS Duration: 78 QT Interval:  388 QTC Calculation: 412 R Axis:   72 Text Interpretation: Normal sinus rhythm Normal ECG No previous ECGs available Confirmed by Tilden Fossa [phone]) on 06/25/2022 12:20:05 AM  Radiology DG Chest 2 View  Result Date: 06/24/2022 CLINICAL DATA:  Chest pain. EXAM: CHEST - 2 VIEW COMPARISON:  None Available. FINDINGS: The heart size and mediastinal contours are  within normal limits. Both lungs are clear. The visualized skeletal structures are unremarkable. IMPRESSION: No active cardiopulmonary disease. Electronically Signed   By: Aram Candela M.D.   On: 06/24/2022 22:54    Procedures Procedures    Medications Ordered in ED Medications - No data to display  ED Course/ Medical Decision Making/ A&P                             Medical Decision Making Amount and/or Complexity of Data Reviewed Labs: ordered. Radiology: ordered.   Patient here for evaluation of central chest pain since yesterday.  Lungs are clear on examination with no respiratory distress.  EKG is without acute ischemic changes and she has a negative troponin.  Current clinical picture is not consistent with ACS, PE, pneumonia, dissection.  Chest x-ray is negative for acute infiltrate-images personally reviewed and interpreted, agree with radiologist interpretation.  Suspect that patient does have some reflux.  Discussed home care with OTC medications.  Discussed outpatient follow-up and return precautions.        Final Clinical Impression(s) / ED Diagnoses Final diagnoses:  Atypical chest pain  Gastroesophageal reflux disease without esophagitis    Rx / DC Orders ED Discharge Orders     None         Tilden Fossa, MD 06/25/22 0210

## 2022-06-25 NOTE — Discharge Instructions (Signed)
You can take Pepcid and, available over-the-counter twice daily for the next week.

## 2024-01-26 NOTE — Progress Notes (Signed)
 [address] - AMBULATORY [facility] [address] [place] [address]   Date of Service: 01/26/2024 Patient DOB: 04/30/2000    History of Present Illness   Patient ID: Amy Graham is a 24 y.o. female. Patient presents to urgent care with significant other due to flu like sx x 2 days. Reports ha, fatigue, body aches, chills, sneezing, sore throat, cough. Denies sick contacts. Has not used any OTC meds. Denies fever, dysphagia, cp, sob, wheezing, n/v/d.  Active Ambulatory Problems    Diagnosis Date Noted   No Active Ambulatory Problems   Resolved Ambulatory Problems    Diagnosis Date Noted   No Resolved Ambulatory Problems   No Additional Past Medical History    BP 125/90 (BP Location: Left arm, Patient Position: Sitting)   Pulse 69   Ht 1.676 m (5' 6)   Wt 101 kg (223 lb)   BMI 35.99 kg/m    Review of Systems   Review of Systems  Constitutional:  Positive for chills and fatigue.  HENT:  Positive for sneezing and sore throat.   Respiratory:  Positive for cough.   Neurological:  Positive for headaches.     Physicial Exam   Physical Exam Vitals and nursing note reviewed.  Constitutional:      Appearance: Normal appearance. She is normal weight.  HENT:     Head: Normocephalic and atraumatic.     Jaw: No trismus or swelling.     Right Ear: Tympanic membrane, ear canal and external ear normal. No drainage. No mastoid tenderness. Tympanic membrane is not retracted or bulging.     Left Ear: External ear normal. There is impacted cerumen.     Nose: Nose normal.     Mouth/Throat:     Lips: Pink.     Mouth: Mucous membranes are moist.     Dentition: Normal dentition.     Tongue: No lesions.     Palate: No lesions.     Pharynx: Oropharynx is clear. Uvula midline. No oropharyngeal exudate or posterior oropharyngeal erythema.     Tonsils: No tonsillar exudate or tonsillar  abscesses.  Eyes:     Extraocular Movements: Extraocular movements intact.     Conjunctiva/sclera: Conjunctivae normal.  Cardiovascular:     Rate and Rhythm: Normal rate and regular rhythm.     Pulses: Normal pulses.     Heart sounds: Normal heart sounds.  Pulmonary:     Effort: Pulmonary effort is normal.     Breath sounds: Normal breath sounds. No wheezing, rhonchi or rales.  Musculoskeletal:        General: Normal range of motion.     Cervical back: Normal range of motion and neck supple. No tenderness.  Lymphadenopathy:     Cervical: No cervical adenopathy.  Skin:    General: Skin is warm and dry.  Neurological:     Mental Status: She is alert.  Psychiatric:        Mood and Affect: Mood normal.      Labs   Recent Results (from the past 24 hours)  POC Influenza A&B NAT (IDNOW)   Collection Time: 01/26/24  6:10 PM  Result Value Ref Range   Influenza A RNA Positive (A) Negative   Influenza B RNA Negative Negative  POC SARS-COV-2 SYMPTOMATIC (IDNOW)   Collection Time: 01/26/24  6:10 PM  Result Value Ref Range   SARS-COV-2 IDNOW Negative Negative   SARS IDNOW  Information      A rapid, molecular diagnostic test on the IDNOW.  Negative results should be treated as presumptive and, if inconsistent with clinical symptoms should be confirmed with an alternative molecular assay.      Diagnosis   Amy Graham was seen today for flu symptoms.  Diagnoses and all orders for this visit:  Influenza A -     oseltamivir (TAMIFLU) 75 mg capsule; Take 1 capsule (75 mg total) by mouth 2 (two) times a day for 5 days.  Flu-like symptoms -     POC Influenza A&B NAT (IDNOW) -     POC SARS-COV-2 SYMPTOMATIC (IDNOW)     Medical Decision Making Amy Graham is a 24 y.o. female who presents to urgent care today with complaints of  Chief Complaint  Patient presents with   Flu Symptoms    Patient reports cough, headache, sore throat, and low grade fever x2 days. No known sick  contacts. Reports feeling light headed. Motrin  this morning with little relief.     On exam, VS stable and patient is afebrile. Appears well-hydrated and capillary refill <2 seconds.  DDX: Epiglottitis, Peritonsillar abscess, Retropharyngeal abscess,  Pneumonia, Meningitis, Sepsis, more likely Viral or Bacterial URI, Sinusitis, Bronchitis, Strep    Patient presents to urgent care due to flu like sx. Rapid Flu A positive. Discussed risks/benefits of Tamiflu; pt elected for prescription. Supportive care encouraged.   Discharge Information  Symptomatic management discussed.  Patient was given verbal and written instructions on symptoms that necessitate return to the UC/ED, and instructed to f/u w/ UC or PCP if not improving in expected timeframe.   Patient/parent has been instructed on RX/OTC medications, dosages, side effects, and possible interactions as associated with each diagnosis in my impression and plan above.   Patient education (verbal/handout) given on diagnosis, pathophysiology, treatment of diagnosis, side effects of medication use for treatment, restrictions while taking medication, supportives measures such as staying hydrated.   Red Flags associated with diagnosis/es were reviewed and patient instructed on action plan if red flags develop.   They have been instructed that if symptoms worsen or red flags develop they should return to Urgent Care, go to the nearest ED, or activate EMS/911.     Patient and/or parent/guardian (if applicable) agreed with plan and voiced understanding.  No barriers to adherence perceived by myself.   Portions of this note may have been dictated using Dragon dictation software/hardware and may contain grammatical or spelling errors.    If a new prescription was given today, then I discussed potential side effects, drug interactions, instructions for taking the medication, and the consequences of not taking it.    F/u: Follow up closely with primary  care provider (PCP) and other specialists for further care and routine care, but seek medical attention sooner if worsening/concerning signs or symptoms.  Home Care   Electronically signed by: Darryle Slater Fish, PA-C 01/26/2024 6:12 PM

## 2024-02-05 ENCOUNTER — Ambulatory Visit: Admitting: Certified Nurse Midwife

## 2024-02-05 VITALS — BP 118/82 | HR 76 | Wt 224.0 lb

## 2024-02-05 DIAGNOSIS — Z30432 Encounter for removal of intrauterine contraceptive device: Secondary | ICD-10-CM | POA: Diagnosis not present

## 2024-02-05 DIAGNOSIS — Z124 Encounter for screening for malignant neoplasm of cervix: Secondary | ICD-10-CM

## 2024-02-05 NOTE — Progress Notes (Signed)
" ° ° °  GYNECOLOGY CLINIC PROCEDURE NOTE  Ms. Amy Graham is a 24 y.o. G1P1001 here for Liletta  IUD removal. No GYN concerns.  Last pap smear was on 06/08/20 and was normal.  IUD Removal   Patient was in the dorsal lithotomy position, normal external genitalia was noted.  A speculum was placed in the patient's vagina, normal discharge was noted, no lesions. The multiparous cervix was visualized, no lesions, no abnormal discharge.  The strings of the IUD were grasped and pulled using ring forceps. The IUD was removed in its entirety.  (The strings of the IUD were not visualized, so Kelly forceps were introduced into the endometrial cavity and the IUD was grasped and removed in its entirety).  Patient tolerated the procedure well.     Emilio Delilah HERO, CNM 02/05/2024 4:40 PM   "
